# Patient Record
Sex: Female | Born: 1985 | Race: Black or African American | Hispanic: No | Marital: Single | Smoking: Never smoker
Health system: Southern US, Community
[De-identification: ages and names within clinical notes are randomized; demographics above are authoritative.]

## PROBLEM LIST (undated history)

## (undated) DIAGNOSIS — I10 Essential (primary) hypertension: Secondary | ICD-10-CM

---
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
## 2007-01-26 ENCOUNTER — Emergency Department: Payer: Self-pay | Admitting: Emergency Medicine

## 2007-02-02 ENCOUNTER — Emergency Department: Payer: Self-pay | Admitting: Emergency Medicine

## 2007-07-23 ENCOUNTER — Observation Stay: Payer: Self-pay | Admitting: Obstetrics and Gynecology

## 2007-09-17 ENCOUNTER — Inpatient Hospital Stay: Payer: Self-pay | Admitting: Obstetrics and Gynecology

## 2009-03-02 ENCOUNTER — Emergency Department: Payer: Self-pay | Admitting: Emergency Medicine

## 2009-03-03 ENCOUNTER — Inpatient Hospital Stay (HOSPITAL_COMMUNITY): Admission: AD | Admit: 2009-03-03 | Discharge: 2009-03-03 | Payer: Self-pay | Admitting: Obstetrics & Gynecology

## 2009-06-09 ENCOUNTER — Emergency Department: Payer: Self-pay | Admitting: Unknown Physician Specialty

## 2009-09-08 ENCOUNTER — Inpatient Hospital Stay (HOSPITAL_COMMUNITY): Admission: AD | Admit: 2009-09-08 | Discharge: 2009-09-10 | Payer: Self-pay | Admitting: Obstetrics and Gynecology

## 2009-09-08 ENCOUNTER — Encounter: Payer: Self-pay | Admitting: Obstetrics & Gynecology

## 2009-09-08 ENCOUNTER — Ambulatory Visit: Payer: Self-pay | Admitting: Advanced Practice Midwife

## 2010-03-22 LAB — CBC
HCT: 30.8 % — ABNORMAL LOW (ref 36.0–46.0)
Hemoglobin: 10.4 g/dL — ABNORMAL LOW (ref 12.0–15.0)
Hemoglobin: 10.8 g/dL — ABNORMAL LOW (ref 12.0–15.0)
MCH: 29.7 pg (ref 26.0–34.0)
MCHC: 34.2 g/dL (ref 30.0–36.0)
MCV: 86.7 fL (ref 78.0–100.0)
Platelets: 241 10*3/uL (ref 150–400)
Platelets: 261 10*3/uL (ref 150–400)
RBC: 3.56 MIL/uL — ABNORMAL LOW (ref 3.87–5.11)
RBC: 3.62 MIL/uL — ABNORMAL LOW (ref 3.87–5.11)
RDW: 14.6 % (ref 11.5–15.5)

## 2010-03-22 LAB — PROTEIN / CREATININE RATIO, URINE
Creatinine, Urine: 40.3 mg/dL
Protein Creatinine Ratio: 0.32 — ABNORMAL HIGH (ref 0.00–0.15)
Total Protein, Urine: 13 mg/dL

## 2010-03-22 LAB — COMPREHENSIVE METABOLIC PANEL
AST: 29 U/L (ref 0–37)
Alkaline Phosphatase: 139 U/L — ABNORMAL HIGH (ref 39–117)
BUN: 1 mg/dL — ABNORMAL LOW (ref 6–23)
Calcium: 8.5 mg/dL (ref 8.4–10.5)
Potassium: 3.3 mEq/L — ABNORMAL LOW (ref 3.5–5.1)
Total Protein: 5.8 g/dL — ABNORMAL LOW (ref 6.0–8.3)

## 2010-03-22 LAB — STREP B DNA PROBE

## 2010-03-30 LAB — URINE CULTURE

## 2010-03-30 LAB — URINALYSIS, ROUTINE W REFLEX MICROSCOPIC
Glucose, UA: NEGATIVE mg/dL
Ketones, ur: NEGATIVE mg/dL
Nitrite: POSITIVE — AB
Specific Gravity, Urine: 1.015 (ref 1.005–1.030)
Urobilinogen, UA: 0.2 mg/dL (ref 0.0–1.0)
pH: 7 (ref 5.0–8.0)

## 2010-03-30 LAB — URINE MICROSCOPIC-ADD ON

## 2010-03-30 LAB — CBC
RBC: 4.22 MIL/uL (ref 3.87–5.11)
RDW: 13.6 % (ref 11.5–15.5)
WBC: 10.5 10*3/uL (ref 4.0–10.5)

## 2010-03-30 LAB — GC/CHLAMYDIA PROBE AMP, GENITAL
Chlamydia, DNA Probe: NEGATIVE
GC Probe Amp, Genital: NEGATIVE

## 2010-03-30 LAB — ABO/RH: ABO/RH(D): O POS

## 2010-06-13 ENCOUNTER — Emergency Department (HOSPITAL_COMMUNITY)
Admission: EM | Admit: 2010-06-13 | Discharge: 2010-06-13 | Disposition: A | Discharge status: Home / Self Care | Payer: Self-pay | Attending: Emergency Medicine | Admitting: Emergency Medicine

## 2010-06-13 DIAGNOSIS — J45909 Unspecified asthma, uncomplicated: Secondary | ICD-10-CM | POA: Insufficient documentation

## 2010-06-13 DIAGNOSIS — I10 Essential (primary) hypertension: Secondary | ICD-10-CM | POA: Insufficient documentation

## 2010-06-13 LAB — POCT I-STAT, CHEM 8
BUN: 13 mg/dL (ref 6–23)
Calcium, Ion: 1.14 mmol/L (ref 1.12–1.32)
Chloride: 108 mEq/L (ref 96–112)
Creatinine, Ser: 0.8 mg/dL (ref 0.4–1.2)
Glucose, Bld: 78 mg/dL (ref 70–99)
Hemoglobin: 13.6 g/dL (ref 12.0–15.0)
Potassium: 3.5 mEq/L (ref 3.5–5.1)
Sodium: 142 mEq/L (ref 135–145)

## 2010-08-16 ENCOUNTER — Emergency Department (HOSPITAL_COMMUNITY)
Admission: EM | Admit: 2010-08-16 | Discharge: 2010-08-16 | Disposition: A | Discharge status: Home / Self Care | Payer: Self-pay | Attending: Emergency Medicine | Admitting: Emergency Medicine

## 2010-08-16 DIAGNOSIS — I10 Essential (primary) hypertension: Secondary | ICD-10-CM | POA: Insufficient documentation

## 2010-08-16 DIAGNOSIS — N949 Unspecified condition associated with female genital organs and menstrual cycle: Secondary | ICD-10-CM | POA: Insufficient documentation

## 2010-08-16 DIAGNOSIS — N938 Other specified abnormal uterine and vaginal bleeding: Secondary | ICD-10-CM | POA: Insufficient documentation

## 2010-08-16 LAB — CBC
HCT: 38.5 % (ref 36.0–46.0)
MCV: 83.5 fL (ref 78.0–100.0)
Platelets: 304 10*3/uL (ref 150–400)
RDW: 13.4 % (ref 11.5–15.5)

## 2010-08-16 LAB — DIFFERENTIAL
Basophils Absolute: 0 10*3/uL (ref 0.0–0.1)
Basophils Relative: 0 % (ref 0–1)
Eosinophils Absolute: 0.2 10*3/uL (ref 0.0–0.7)
Eosinophils Relative: 2 % (ref 0–5)
Lymphs Abs: 3.1 10*3/uL (ref 0.7–4.0)
Monocytes Absolute: 0.5 10*3/uL (ref 0.1–1.0)
Neutrophils Relative %: 54 % (ref 43–77)

## 2010-08-16 LAB — URINALYSIS, ROUTINE W REFLEX MICROSCOPIC
Bilirubin Urine: NEGATIVE
Glucose, UA: NEGATIVE mg/dL
Ketones, ur: NEGATIVE mg/dL
Protein, ur: 30 mg/dL — AB
Specific Gravity, Urine: 1.026 (ref 1.005–1.030)
pH: 6 (ref 5.0–8.0)

## 2010-08-16 LAB — URINE MICROSCOPIC-ADD ON

## 2010-08-16 LAB — WET PREP, GENITAL
Clue Cells Wet Prep HPF POC: NONE SEEN
Yeast Wet Prep HPF POC: NONE SEEN

## 2010-08-17 LAB — GC/CHLAMYDIA PROBE AMP, GENITAL: GC Probe Amp, Genital: NEGATIVE

## 2011-02-20 ENCOUNTER — Emergency Department (HOSPITAL_COMMUNITY): Payer: Self-pay

## 2011-02-20 ENCOUNTER — Emergency Department (HOSPITAL_COMMUNITY)
Admission: EM | Admit: 2011-02-20 | Discharge: 2011-02-20 | Disposition: A | Discharge status: Home / Self Care | Payer: Self-pay | Attending: Emergency Medicine | Admitting: Emergency Medicine

## 2011-02-20 ENCOUNTER — Other Ambulatory Visit: Payer: Self-pay

## 2011-02-20 ENCOUNTER — Encounter (HOSPITAL_COMMUNITY): Payer: Self-pay | Admitting: Emergency Medicine

## 2011-02-20 DIAGNOSIS — R091 Pleurisy: Secondary | ICD-10-CM | POA: Insufficient documentation

## 2011-02-20 DIAGNOSIS — R0602 Shortness of breath: Secondary | ICD-10-CM | POA: Insufficient documentation

## 2011-02-20 DIAGNOSIS — R071 Chest pain on breathing: Secondary | ICD-10-CM | POA: Insufficient documentation

## 2011-02-20 DIAGNOSIS — J45901 Unspecified asthma with (acute) exacerbation: Secondary | ICD-10-CM | POA: Insufficient documentation

## 2011-02-20 DIAGNOSIS — I10 Essential (primary) hypertension: Secondary | ICD-10-CM | POA: Insufficient documentation

## 2011-02-20 HISTORY — DX: Essential (primary) hypertension: I10

## 2011-02-20 MED ORDER — ALBUTEROL SULFATE HFA 108 (90 BASE) MCG/ACT IN AERS
2.0000 | INHALATION_SPRAY | Freq: Once | RESPIRATORY_TRACT | Status: AC
  Administered 2011-02-20: 2 via RESPIRATORY_TRACT
  Filled 2011-02-20: qty 6.7

## 2011-02-20 MED ORDER — ACETAMINOPHEN 325 MG PO TABS
650.0000 mg | ORAL_TABLET | Freq: Once | ORAL | Status: AC
  Administered 2011-02-20: 650 mg via ORAL
  Filled 2011-02-20: qty 2

## 2011-02-20 MED ORDER — PREDNISONE 10 MG PO TABS: 40.0000 mg | ORAL_TABLET | Freq: Every day | ORAL | Status: DC

## 2011-02-20 NOTE — Discharge Instructions (Signed)
Your ECG and chest x-ray are both normal. You vital signs are normal as well. I suspect your chest pain is partially due to asthma, and partially to inflammation around your lungs. Start taking ibuprofen 400mg  every 6hrs for pain and inflammation. Take prednisone as prescribed until all gone. Use albuterol inhaler 2 puffs every 4 hrs for shortness of breath. Follow up with primary care doctor here in the area.   RESOURCE GUIDE  Dental Problems  Patients with Medicaid: [facility] [phone] [address]                                           [phone] Phone:  [phone]                                                  Phone:  [phone]  If unable to pay or uninsured, contact:  [facility] or [facility] to become qualified for the adult dental clinic.  Chronic Pain Problems Contact [facility]  [phone] Patients need to be referred by their primary care doctor.  Insufficient Money for Medicine Contact [employer]:  call "211" or [facility] [phone].  No Primary Care Doctor Call [facility]  [phone] Other agencies that provide inexpensive medical care    [facility]  [phone]    [facility]  [phone]    [facility]  [phone]    [facility]  [phone]    [facility]  [phone]    [facility]  [phone]  Psychological Services [facility]al [facility]  [phone] [facility] Services  [phone] [facility] Mental Health   [phone] (emergency services [phone])  Substance Abuse Resources [facility]  [phone] [facility] [phone] [facility] [phone] Floydene Flock [phone] Residential & Outpatient Substance Abuse Program  [phone]  Abuse/Neglect [facility] Child Abuse Hotline 928-704-6570 [facility] Child Abuse Hotline (907)334-7309 (After  Hours)  Emergency Shelter [facility] [phone]  M[facility] [phone] [facility] [phone]  MRSA Hotline #:   708-760-0229    [facility] Resources  [facility]     [employer]                          [facility] Dept. [address] [place]                       [address]      [address]  Patrecia Pace  Michell Heinrich Phone:  [phone]                                   Phone:  [phone]                 Phone:  [phone]  [facility] Mental Health Phone:  [phone]  [facility] Child Abuse Hotline 865-280-4676 8034896035 (After Hours)    Asthma, Adult Asthma is caused by narrowing of the air passages in the lungs. It may be triggered by pollen, dust, animal dander, molds, some foods, respiratory infections, exposure to smoke, exercise, emotional stress or other allergens (things that cause allergic reactions or allergies). Repeat attacks are common. HOME CARE INSTRUCTIONS   Use prescription medications as ordered by your caregiver.   Avoid pollen, dust, animal dander, molds, smoke and other things that cause attacks at home and at work.   You may have fewer attacks if you decrease dust in your home. Electrostatic air cleaners may help.   It may help to replace your pillows or mattress with materials less likely to cause allergies.   Talk to your caregiver about an action plan for managing asthma attacks at home, including, the use of a peak flow meter which measures the severity of your asthma attack. An action plan can help minimize or stop the attack without having to seek medical care.   If you are not on a fluid restriction, drink 8 to 10 glasses of water each day.   Always have a plan prepared for seeking medical attention, including, calling  your physician, accessing local emergency care, and calling 911 (in the [place] for a severe attack.   Discuss possible exercise routines with your caregiver.   If animal dander is the cause of asthma, you may need to get rid of pets.  SEEK MEDICAL CARE IF:   You have wheezing and shortness of breath even if taking medicine to prevent attacks.   You have muscle aches, chest pain or thickening of sputum.   Your sputum changes from clear or white to yellow, green, gray, or bloody.   You have any problems that may be related to the medicine you are taking (such as a rash, itching, swelling or trouble breathing).  SEEK IMMEDIATE MEDICAL CARE IF:   Your usual medicines do not stop your wheezing or there is increased coughing and/or shortness of breath.   You have increased difficulty breathing.   You have a fever.  MAKE SURE YOU:   Understand these instructions.   Will watch your condition.   Will get help right away if you are not doing well or get worse.  Document Released: 12/24/2004 Document Revised: 09/05/2010 Document Reviewed: 08/12/2007 [facility] Patient Information 2012 [place], [place].

## 2011-02-20 NOTE — ED Notes (Signed)
Sob hard to breath  x2 days ran out of inhaler  First of jan  Just moved her from [place]

## 2011-02-20 NOTE — ED Provider Notes (Signed)
History     CSN: [id number]  Arrival date & time 02/20/11  1021   First MD Initiated Contact with Patient 02/20/11 1040      Chief Complaint  Patient presents with  . Shortness of Breath    (Consider location/radiation/quality/duration/timing/severity/associated sxs/prior treatment) Patient is a 26 y.o. female presenting with shortness of breath. The history is provided by the patient.  Shortness of Breath  The current episode started 2 days ago. The onset was gradual. The problem has been unchanged. The problem is moderate. The symptoms are relieved by nothing. Associated symptoms include chest pain and shortness of breath. Pertinent negatives include no chest pressure, no fever, no rhinorrhea, no cough and no wheezing. There was no intake of a foreign body. Her past medical history is significant for asthma.  Pt states she has history of asthma, ran out of her inhaler. States she has had no problems with asthma in the last year until about two days ago. States also having pleuritic chest pain, mainly in the midline of the chest, worse with deep inspiration and palpation. Denies fever, chills. URI symptoms, cough. No recent travel or surgeries. No swelling in legs. Denies being pregnant. Not taking OCPs.  Past Medical History  Diagnosis Date  . Asthma   . Hypertension     No past surgical history on file.  No family history on file.  History  Substance Use Topics  . Smoking status: Not on file  . Smokeless tobacco: Not on file  . Alcohol Use:     OB History    Grav Para Term Preterm Abortions TAB SAB Ect Mult Living                  Review of Systems  Constitutional: Negative for fever and chills.  HENT: Negative for ear pain, rhinorrhea, neck pain and neck stiffness.   Eyes: Negative.   Respiratory: Positive for chest tightness and shortness of breath. Negative for cough and wheezing.   Cardiovascular: Positive for chest pain. Negative for palpitations and leg  swelling.  Gastrointestinal: Negative.   Genitourinary: Negative.   Musculoskeletal: Negative.   Neurological: Negative.   Psychiatric/Behavioral: Negative.     Allergies  Review of patient's allergies indicates no known allergies.  Home Medications   Current Outpatient Rx  Name Route Sig Dispense Refill  . ALBUTEROL SULFATE HFA 108 (90 BASE) MCG/ACT IN AERS Inhalation Inhale 2 puffs into the lungs 2 (two) times daily as needed. As needed for shortness of breath.      BP 155/100  Pulse 89  Temp(Src) 97.1 F (36.2 C) (Oral)  Resp 16  SpO2 97%  LMP 02/08/2011  Physical Exam  Nursing note and vitals reviewed. Constitutional: She is oriented to person, place, and time. She appears well-developed and well-nourished. No distress.  HENT:  Head: Normocephalic.  Eyes: Conjunctivae and EOM are normal. Pupils are equal, round, and reactive to light.  Neck: Neck supple.  Cardiovascular: Normal rate, regular rhythm and normal heart sounds.   Pulmonary/Chest: Effort normal and breath sounds normal. No respiratory distress. She has no wheezes. She has no rales.  Abdominal: Soft. Bowel sounds are normal.  Musculoskeletal: Normal range of motion. She exhibits no edema.  Neurological: She is alert and oriented to person, place, and time.  Skin: Skin is warm and dry. No erythema.  Psychiatric: She has a normal mood and affect.    ED Course  Procedures (including critical care time)  Labs Reviewed - No data to  display Dg Chest 2 View  02/20/2011  *RADIOLOGY REPORT*  Clinical Data: Shortness of breath and cough.  CHEST - 2 VIEW  Comparison: None.  Findings: Trachea is midline.  Heart size normal.  Lungs are somewhat low in volume but clear.  No pleural fluid.  IMPRESSION: No acute findings.  Original Report Authenticated By: Reyes Ivan, M.D.    Pt with shortness of breath, pleurisy. She is PERC and Well's negative for PE. VS normal. ECG normal. Suspect mild exacerbation of asthma.  Will d/c home with outpatient follow up.    Date: 02/20/2011  Rate: 94  Rhythm: normal sinus rhythm  QRS Axis: normal  Intervals: normal  ST/T Wave abnormalities: normal  Conduction Disutrbances: none  Narrative Interpretation:   Old EKG Reviewed: no prior ecg   No diagnosis found.    MDM          Lottie Mussel, PA 02/20/11 1137

## 2011-02-20 NOTE — ED Provider Notes (Signed)
Medical screening examination/treatment/procedure(s) were performed by non-physician practitioner and as supervising physician I was immediately available for consultation/collaboration.  Toy Baker, MD 02/20/11 2028

## 2011-02-20 NOTE — ED Notes (Signed)
MD at bedside. 

## 2011-02-20 NOTE — ED Notes (Signed)
Pt received to STR 5 with c/o SOB onset 2 days ago. Pt has a hx of asthma and has been out of her inhaler x 1 year . Pt also c/o chest tightness. Pt denies any cough nor fever

## 2011-07-22 ENCOUNTER — Emergency Department (HOSPITAL_COMMUNITY)
Admission: EM | Admit: 2011-07-22 | Discharge: 2011-07-22 | Disposition: A | Discharge status: Home Health | Payer: Self-pay | Attending: Emergency Medicine | Admitting: Emergency Medicine

## 2011-07-22 ENCOUNTER — Encounter (HOSPITAL_COMMUNITY): Payer: Self-pay | Admitting: *Deleted

## 2011-07-22 DIAGNOSIS — R531 Weakness: Secondary | ICD-10-CM

## 2011-07-22 DIAGNOSIS — J45909 Unspecified asthma, uncomplicated: Secondary | ICD-10-CM | POA: Insufficient documentation

## 2011-07-22 DIAGNOSIS — I10 Essential (primary) hypertension: Secondary | ICD-10-CM | POA: Insufficient documentation

## 2011-07-22 DIAGNOSIS — R5381 Other malaise: Secondary | ICD-10-CM | POA: Insufficient documentation

## 2011-07-22 LAB — POCT I-STAT, CHEM 8
Calcium, Ion: 1.24 mmol/L — ABNORMAL HIGH (ref 1.12–1.23)
Chloride: 106 mEq/L (ref 96–112)
Glucose, Bld: 112 mg/dL — ABNORMAL HIGH (ref 70–99)
HCT: 40 % (ref 36.0–46.0)
Hemoglobin: 13.6 g/dL (ref 12.0–15.0)

## 2011-07-22 NOTE — ED Provider Notes (Signed)
History    This chart was scribed for Suzi Roots, MD, MD by Smitty Pluck. The patient was seen in room TR04C and the patient's care was started at 4:25PM.   CSN: [id number]  Arrival date & time 07/22/11  1549   None     Chief Complaint  Patient presents with  . Fatigue    (Consider location/radiation/quality/duration/timing/severity/associated sxs/prior treatment) The history is provided by the patient.   Angela Friedman is a 26 y.o. female who presents to the Emergency Department complaining of moderate fatigue onset today while at work. 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 Denies vomiting and diarrhea. Reports having normal food and liquid intake. Reports having mild intermittent frontal headache. Pt has hx of migraines. todays headache, gradual in onset, mild at onset, denies any severe head pain or severe headache. No eye pain or change in vision. No nv. No neck pain or stiffness. No trauma or head injury. No numbness/weakness.  Denies being exposed to new chemicals or strong odors at work. Denies having recent blood lose, heavy menstrual cycle, being pregnant, palpitations, heart conditions, chest pain and SOB. Denies change in stools, melena or rectal bleeding. Pt states no chance of being pregnant. No abd/pelv pain. No vaginal bleeding. No fever or chills. Denies any current or recent cp. No palpitations or irreg hr. No recent new meds or any drug use.      Past Medical History  Diagnosis Date  . Asthma   . Hypertension     History reviewed. No pertinent past surgical history.  History reviewed. No pertinent family history.  History  Substance Use Topics  . Smoking status: Never Smoker   . Smokeless tobacco: Not on file  . Alcohol Use: No    OB History    Grav Para Term Preterm Abortions TAB SAB Ect Mult Living                  Review of Systems  Constitutional: Positive for fatigue. Negative for fever and chills.  HENT: Negative for sore  throat, neck pain and neck stiffness.   Eyes: Negative for visual disturbance.  Respiratory: Negative for cough and shortness of breath.   Cardiovascular: Negative for chest pain, palpitations and leg swelling.  Gastrointestinal: Negative for nausea, vomiting, abdominal pain and diarrhea.  Genitourinary: Negative for dysuria, vaginal bleeding and vaginal discharge.  Musculoskeletal: Negative for back pain.  Skin: Negative for rash.  Neurological: Positive for headaches. Negative for syncope and numbness.    Allergies  Review of patient's allergies indicates no known allergies.  Home Medications   Current Outpatient Rx  Name Route Sig Dispense Refill  . IBUPROFEN 200 MG PO TABS Oral Take 400 mg by mouth every 6 (six) hours as needed. For pain    . ALBUTEROL SULFATE HFA 108 (90 BASE) MCG/ACT IN AERS Inhalation Inhale 2 puffs into the lungs 2 (two) times daily as needed. As needed for shortness of breath.      BP 130/91  Pulse 101  Temp 98.8 F (37.1 C) (Oral)  Resp 18  SpO2 98%  Physical Exam  Nursing note and vitals reviewed. Constitutional: She is oriented to person, place, and time. She appears well-developed and well-nourished. No distress.  HENT:  Head: Normocephalic and atraumatic.  Nose: Nose normal.  Mouth/Throat: Oropharynx is clear and moist.       No sinus or temporal tenderness  Eyes: Conjunctivae and EOM are normal. Pupils are equal, round, and reactive to  light. No scleral icterus.  Neck: Neck supple. No tracheal deviation present.       No stiffness or rigidity  Cardiovascular: Normal rate, regular rhythm, normal heart sounds and intact distal pulses.  Exam reveals no gallop and no friction rub.   No murmur heard. Pulmonary/Chest: Effort normal and breath sounds normal. No respiratory distress.  Abdominal: Soft. Bowel sounds are normal. She exhibits no distension and no mass. There is no tenderness. There is no rebound and no guarding.  Genitourinary:        No cva tenderness  Musculoskeletal: Normal range of motion.  Neurological: She is alert and oriented to person, place, and time. No cranial nerve deficit.       Motor intact bil. Steady gait.   Skin: Skin is warm and dry.  Psychiatric: She has a normal mood and affect. Her behavior is normal.    ED Course  Procedures (including critical care time) DIAGNOSTIC STUDIES: Oxygen Saturation is 98% on room air, normal by my interpretation.    COORDINATION OF CARE:  Results for orders placed during the hospital encounter of 07/22/11  POCT I-STAT, CHEM 8      Component Value Range   Sodium 142  135 - 145 mEq/L   Potassium 3.8  3.5 - 5.1 mEq/L   Chloride 106  96 - 112 mEq/L   BUN 8  6 - 23 mg/dL   Creatinine, Ser 8.46  0.50 - 1.10 mg/dL   Glucose, Bld 962 (*) 70 - 99 mg/dL   Calcium, Ion 9.52 (*) 1.12 - 1.23 mmol/L   TCO2 24  0 - 100 mmol/L   Hemoglobin 13.6  12.0 - 15.0 g/dL   HCT 84.1  32.4 - 40.1 %         MDM  I personally performed the services described in this documentation, which was scribed in my presence. The recorded information has been reviewed and considered. Suzi Roots, MD  Pt states at work stands all day. States in generally air-conditioned but was very hot today. Feels better now.  Tylenol po. Po fluids.   Pt ambulatory about ed, no faintness or dizziness. Tolerates po.   Hr 84 rr 16. Nad. No pain.     Suzi Roots, MD 07/22/11 [phone]

## 2011-07-22 NOTE — ED Notes (Signed)
Pt states she was at work when she started to feel mildly light headed and weak. Denies fever, sob, or chest pain. Pt states she feels weak and points to bilateral shoulders. Pt reports hx of intermittent headaches for last couple days and otc will relieve symptoms temporarily.

## 2013-09-25 ENCOUNTER — Emergency Department: Payer: Self-pay | Admitting: Emergency Medicine

## 2013-09-25 LAB — COMPREHENSIVE METABOLIC PANEL
ALBUMIN: 3.2 g/dL — AB (ref 3.4–5.0)
ALK PHOS: 92 U/L
ANION GAP: 10 (ref 7–16)
BILIRUBIN TOTAL: 0.2 mg/dL (ref 0.2–1.0)
BUN: 7 mg/dL (ref 7–18)
CALCIUM: 8.7 mg/dL (ref 8.5–10.1)
CO2: 24 mmol/L (ref 21–32)
Chloride: 105 mmol/L (ref 98–107)
Creatinine: 0.75 mg/dL (ref 0.60–1.30)
GLUCOSE: 83 mg/dL (ref 65–99)
Osmolality: 275 (ref 275–301)
Potassium: 3.2 mmol/L — ABNORMAL LOW (ref 3.5–5.1)
SGOT(AST): 36 U/L (ref 15–37)
SGPT (ALT): 37 U/L
Sodium: 139 mmol/L (ref 136–145)
Total Protein: 7.2 g/dL (ref 6.4–8.2)

## 2013-09-25 LAB — CBC
HCT: 37.8 % (ref 35.0–47.0)
HGB: 12.3 g/dL (ref 12.0–16.0)
MCH: 27.9 pg (ref 26.0–34.0)
MCHC: 32.7 g/dL (ref 32.0–36.0)
MCV: 85 fL (ref 80–100)
Platelet: 294 10*3/uL (ref 150–440)
RBC: 4.42 10*6/uL (ref 3.80–5.20)
RDW: 13.8 % (ref 11.5–14.5)
WBC: 9.2 10*3/uL (ref 3.6–11.0)

## 2013-09-25 LAB — TROPONIN I: Troponin-I: <0.02 ng/mL

## 2013-09-25 LAB — LIPASE, BLOOD: Lipase: 77 U/L (ref 73–393)

## 2014-03-11 ENCOUNTER — Emergency Department: Payer: Self-pay | Admitting: Emergency Medicine

## 2014-07-29 ENCOUNTER — Emergency Department
Admission: EM | Admit: 2014-07-29 | Discharge: 2014-07-29 | Disposition: A | Discharge status: Home / Self Care | Payer: Self-pay | Attending: Emergency Medicine | Admitting: Emergency Medicine

## 2014-07-29 ENCOUNTER — Other Ambulatory Visit: Payer: Self-pay

## 2014-07-29 DIAGNOSIS — R079 Chest pain, unspecified: Secondary | ICD-10-CM | POA: Insufficient documentation

## 2014-07-29 MED ORDER — GI COCKTAIL ~~LOC~~
30.0000 mL | Freq: Once | ORAL | Status: AC
  Administered 2014-07-29: 30 mL via ORAL

## 2014-07-29 MED ORDER — GI COCKTAIL ~~LOC~~
ORAL | Status: AC
Start: 2014-07-29 — End: 2014-07-29
  Administered 2014-07-29: 30 mL via ORAL
  Filled 2014-07-29: qty 30

## 2014-07-29 MED ORDER — CYCLOBENZAPRINE HCL 10 MG PO TABS: 10.0000 mg | ORAL_TABLET | Freq: Three times a day (TID) | ORAL | Status: DC | PRN

## 2014-07-29 NOTE — Discharge Instructions (Signed)

## 2014-07-29 NOTE — ED Provider Notes (Signed)
[facility] Emergency Department Provider Note  ____________________________________________  Time seen: Approximately 9:30 PM  I have reviewed the triage vital signs and the nursing notes.   HISTORY  Chief Complaint Chest Pain    HPI Angela Friedman is a 29 y.o. female with a history of hypertension who is presenting today with chest pain over the past day. Says the chest pain is central and sharp. It can last for about 10 minutes at a time. It is exacerbated by movement. It is worse with bending. She works in a [employer] and does heavy lifting at her job. She denies any shortness of breath or pain with deep breathing. She has tried ibuprofen for the pain which has not provided any relief. Has a history of coronary artery disease with her father who died in his 78s. No known cardiac death at young age and her family. No exogenous hormones at this time. Is not on birth control this time. No chest pain at this time. Was given a GI cocktail prior to my interview and said provided minimal relief.   Past Medical History  Diagnosis Date  . Asthma   . Hypertension     There are no active problems to display for this patient.   No past surgical history on file.  Current Outpatient Rx  Name  Route  Sig  Dispense  Refill  . albuterol (PROVENTIL HFA;VENTOLIN HFA) 108 (90 BASE) MCG/ACT inhaler   Inhalation   Inhale 2 puffs into the lungs 2 (two) times daily as needed. As needed for shortness of breath.         Marland Kitchen ibuprofen (ADVIL,MOTRIN) 200 MG tablet   Oral   Take 400 mg by mouth every 6 (six) hours as needed. For pain           Allergies Review of patient's allergies indicates no known allergies.  No family history on file.  Social History History  Substance Use Topics  . Smoking status: Never Smoker   . Smokeless tobacco: Not on file  . Alcohol Use: No    Review of Systems Constitutional: No fever/chills Eyes: No visual changes. ENT: No sore  throat. Cardiovascular: As above Respiratory: Denies shortness of breath. Gastrointestinal: No abdominal pain.  No nausea, no vomiting.  No diarrhea.  No constipation. Genitourinary: Negative for dysuria. Musculoskeletal: Negative for back pain. Skin: Negative for rash. Neurological: Negative for headaches, focal weakness or numbness.  10-point ROS otherwise negative.  ____________________________________________   PHYSICAL EXAM:  VITAL SIGNS: ED Triage Vitals  Enc Vitals Group     BP 07/29/14 2050 157/98 mmHg     Pulse Rate 07/29/14 2050 90     Resp --      Temp 07/29/14 2050 98.3 F (36.8 C)     Temp Source 07/29/14 2050 Oral     SpO2 07/29/14 2050 100 %     Weight --      Height --      Head Cir --      Peak Flow --      Pain Score 07/29/14 2039 8     Pain Loc --      Pain Edu? --      Excl. in GC? --     Constitutional: Alert and oriented. Well appearing and in no acute distress. Eyes: Conjunctivae are normal. PERRL. EOMI. Head: Atraumatic. Nose: No congestion/rhinnorhea. Mouth/Throat: Mucous membranes are moist.  Oropharynx non-erythematous. Neck: No stridor.   Cardiovascular: Normal rate, regular rhythm. Grossly  normal heart sounds.  Good peripheral circulation. Chest pain is reproducible to palpation. Respiratory: Normal respiratory effort.  No retractions. Lungs CTAB. Gastrointestinal: Soft and nontender. No distention. No abdominal bruits. No CVA tenderness. Musculoskeletal: No lower extremity tenderness nor edema.  No joint effusions. Neurologic:  Normal speech and language. No gross focal neurologic deficits are appreciated. No gait instability. Skin:  Skin is warm, dry and intact. No rash noted. Psychiatric: Mood and affect are normal. Speech and behavior are normal.  ____________________________________________   LABS (all labs ordered are listed, but only abnormal results are displayed)  Labs Reviewed - No data to  display ____________________________________________  EKG  ED ECG REPORT I, Birdia Jaycox,  Teena Irani, the attending physician, personally viewed and interpreted this ECG.   Date: 07/29/2014  EKG Time: 2047  Rate: 83  Rhythm: Sinus rhythm with PACs.  Axis: Normal axis  Intervals:none  ST&T Change: No ST elevations or depressions. No abnormal T-wave inversions.  ____________________________________________  RADIOLOGY   ____________________________________________   PROCEDURES    ____________________________________________   INITIAL IMPRESSION / ASSESSMENT AND PLAN / ED COURSE  Pertinent labs & imaging results that were available during my care of the patient were reviewed by me and considered in my medical decision making (see chart for details).  Patient is perc negative. Reproducible chest pain and patient does a lot of lifting on her job. We'll discharge to home. To continue ibuprofen and we'll give her Flexeril. Patient will also try muscle rub such as icy hot and Aspercreme. ____________________________________________   FINAL CLINICAL IMPRESSION(S) / ED DIAGNOSES  Acute chest pain. Initial visit.      Myrna Blazer, MD 07/29/14 2149

## 2014-07-29 NOTE — ED Notes (Signed)
Pt reports midsternal chest pain, burning in nature, worse when lying down. Pt reports pain started yesterday.

## 2015-05-25 ENCOUNTER — Encounter: Payer: Self-pay | Admitting: Emergency Medicine

## 2015-05-25 ENCOUNTER — Emergency Department
Admission: EM | Admit: 2015-05-25 | Discharge: 2015-05-25 | Disposition: A | Discharge status: Home / Self Care | Payer: Medicaid Other | Attending: Student | Admitting: Student

## 2015-05-25 DIAGNOSIS — Z3A10 10 weeks gestation of pregnancy: Secondary | ICD-10-CM | POA: Diagnosis not present

## 2015-05-25 DIAGNOSIS — Y999 Unspecified external cause status: Secondary | ICD-10-CM | POA: Insufficient documentation

## 2015-05-25 DIAGNOSIS — Y929 Unspecified place or not applicable: Secondary | ICD-10-CM | POA: Diagnosis not present

## 2015-05-25 DIAGNOSIS — S40861A Insect bite (nonvenomous) of right upper arm, initial encounter: Secondary | ICD-10-CM

## 2015-05-25 DIAGNOSIS — I10 Essential (primary) hypertension: Secondary | ICD-10-CM | POA: Insufficient documentation

## 2015-05-25 DIAGNOSIS — O26891 Other specified pregnancy related conditions, first trimester: Secondary | ICD-10-CM | POA: Diagnosis not present

## 2015-05-25 DIAGNOSIS — Y939 Activity, unspecified: Secondary | ICD-10-CM | POA: Insufficient documentation

## 2015-05-25 DIAGNOSIS — W57XXXA Bitten or stung by nonvenomous insect and other nonvenomous arthropods, initial encounter: Secondary | ICD-10-CM | POA: Insufficient documentation

## 2015-05-25 DIAGNOSIS — Z79899 Other long term (current) drug therapy: Secondary | ICD-10-CM | POA: Insufficient documentation

## 2015-05-25 DIAGNOSIS — R21 Rash and other nonspecific skin eruption: Secondary | ICD-10-CM | POA: Diagnosis present

## 2015-05-25 DIAGNOSIS — J45909 Unspecified asthma, uncomplicated: Secondary | ICD-10-CM | POA: Diagnosis not present

## 2015-05-25 LAB — CBC WITH DIFFERENTIAL/PLATELET
BASOS ABS: 0 10*3/uL (ref 0–0.1)
Basophils Relative: 0 %
EOS ABS: 0.2 10*3/uL (ref 0–0.7)
Eosinophils Relative: 2 %
HCT: 33.5 % — ABNORMAL LOW (ref 35.0–47.0)
HEMOGLOBIN: 11.3 g/dL — AB (ref 12.0–16.0)
LYMPHS ABS: 2.4 10*3/uL (ref 1.0–3.6)
Lymphocytes Relative: 18 %
MCH: 28.5 pg (ref 26.0–34.0)
MCHC: 33.9 g/dL (ref 32.0–36.0)
MCV: 84.1 fL (ref 80.0–100.0)
Monocytes Absolute: 0.8 10*3/uL (ref 0.2–0.9)
Monocytes Relative: 6 %
Neutro Abs: 10.1 10*3/uL — ABNORMAL HIGH (ref 1.4–6.5)
Platelets: 270 10*3/uL (ref 150–440)
RBC: 3.98 MIL/uL (ref 3.80–5.20)
RDW: 15.3 % — ABNORMAL HIGH (ref 11.5–14.5)
WBC: 13.5 10*3/uL — AB (ref 3.6–11.0)

## 2015-05-25 MED ORDER — CEPHALEXIN 500 MG PO CAPS: 500.0000 mg | ORAL_CAPSULE | Freq: Three times a day (TID) | ORAL | Status: AC

## 2015-05-25 MED ORDER — TRIAMCINOLONE ACETONIDE 0.1 % EX CREA: 1.0000 "application " | TOPICAL_CREAM | Freq: Two times a day (BID) | CUTANEOUS | Status: AC

## 2015-05-25 NOTE — Discharge Instructions (Signed)
Insect Bite Mosquitoes, flies, fleas, bedbugs, and many other insects can bite. Insect bites are different from insect stings. A sting is when poison (venom) is injected into the skin. Insect bites can cause pain or itching for a few days, but they are usually not serious. Some insects can spread diseases to people through a bite. SYMPTOMS  Symptoms of an insect bite include:  Itching or pain in the bite area.  Redness and swelling in the bite area.  An open wound (skin ulcer). In many cases, symptoms last for 2-4 days.  DIAGNOSIS  This condition is usually diagnosed based on symptoms and a physical exam. TREATMENT  Treatment is usually not needed for an insect bite. Symptoms often go away on their own. Your health care provider may recommend creams or lotions to help reduce itching. Antibiotic medicines may be prescribed if the bite becomes infected. A tetanus shot may be given in some cases. If you develop an allergic reaction to an insect bite, your health care provider will prescribe medicines to treat the reaction (antihistamines). This is rare. HOME CARE INSTRUCTIONS  Do not scratch the bite area.  Keep the bite area clean and dry. Wash the bite area daily with soap and water as told by your health care provider.  If directed, applyice to the bite area.  Put ice in a plastic bag.  Place a towel between your skin and the bag.  Leave the ice on for 20 minutes, 2-3 times per day.  To help reduce itching and swelling, try applying a baking soda paste, cortisone cream, or calamine lotion to the bite area as told by your health care provider.  Apply or take over-the-counter and prescription medicines only as told by your health care provider.  If you were prescribed an antibiotic medicine, use it as told by your health care provider. Do not stop using the antibiotic even if your condition improves.  Keep all follow-up visits as told by your health care provider. This is  important. PREVENTION   Use insect repellent. The best insect repellents contain:  DEET, picaridin, oil of lemon eucalyptus (OLE), or IR3535.  Higher amounts of an active ingredient.  When you are outdoors, wear clothing that covers your arms and legs.  Avoid opening windows that do not have window screens. SEEK MEDICAL CARE IF:  You have increased redness, swelling, or pain in the bite area.  You have a fever. SEEK IMMEDIATE MEDICAL CARE IF:   You have joint pain.   You have fluid, blood, or pus coming from the bite area.  You have a headache or neck pain.  You have unusual weakness.  You have a rash.  You have chest pain or shortness of breath.  You have abdominal pain, nausea, or vomiting.  You feel unusually tired or sleepy.   This information is not intended to replace advice given to you by your health care provider. Make sure you discuss any questions you have with your health care provider.   Document Released: 02/01/2004 Document Revised: 09/14/2014 Document Reviewed: 05/11/2014 Elsevier Interactive Patient Education 2016 [employer]  

## 2015-05-25 NOTE — ED Provider Notes (Signed)
[facility] Emergency Department Provider Note  ____________________________________________  Time seen: Approximately 8:08 PM  I have reviewed the triage vital signs and the nursing notes.   HISTORY  Chief Complaint Rash    HPI Angela Friedman is a 30 y.o. female , NAD, presents to the emergency department with one-day history of rash to the right elbow. States she was bitten by an insect yesterday and when she woke this morning the area was more red and inflamed. She went to the health Department for evaluation and was told to take Benadryl. She has taken Benadryl around 10 AM this morning with no relief in symptoms. She is [redacted] weeks pregnant but is not having any abdominal pain, pelvic pain, vaginal discharge or bleeding. She states she does have nausea in which her OB/GYN has given her Zofran to take as needed. Denies fever, chills, body aches. No oozing or weeping about the elbow. Denies any difficulty breathing, swelling about the lips, tongue, throat. Has not had any wheezing or chest pain.   Past Medical History  Diagnosis Date  . Asthma   . Hypertension     There are no active problems to display for this patient.   History reviewed. No pertinent past surgical history.  Current Outpatient Rx  Name  Route  Sig  Dispense  Refill  . albuterol (PROVENTIL HFA;VENTOLIN HFA) 108 (90 BASE) MCG/ACT inhaler   Inhalation   Inhale 2 puffs into the lungs 2 (two) times daily as needed. As needed for shortness of breath.         . cephALEXin (KEFLEX) 500 MG capsule   Oral   Take 1 capsule (500 mg total) by mouth 3 (three) times daily.   21 capsule   0   . cyclobenzaprine (FLEXERIL) 10 MG tablet   Oral   Take 1 tablet (10 mg total) by mouth 3 (three) times daily as needed for muscle spasms.   15 tablet   1   . ibuprofen (ADVIL,MOTRIN) 200 MG tablet   Oral   Take 400 mg by mouth every 6 (six) hours as needed. For pain         . triamcinolone cream  (KENALOG) 0.1 %   Topical   Apply 1 application topically 2 (two) times daily.   30 g   0     Allergies Review of patient's allergies indicates no known allergies.  History reviewed. No pertinent family history.  Social History Social History  Substance Use Topics  . Smoking status: Never Smoker   . Smokeless tobacco: None  . Alcohol Use: No     Review of Systems  Constitutional: No fever/chills ENT: No swelling about lips, tongue, throat. Cardiovascular: No chest pain or palpitations. Respiratory: No cough. No shortness of breath. No wheezing.  Gastrointestinal: Positive nausea but no vomiting. No abdominal pain.  No vomiting Genitourinary: Negative for vaginal pain, discharge, bleeding. Musculoskeletal: Negative for back pain.  Skin: Positive insect bite with surrounding erythema about right elbow. Neurological: Negative for headaches, focal weakness or numbness. 10-point ROS otherwise negative.  ____________________________________________   PHYSICAL EXAM:  VITAL SIGNS: ED Triage Vitals  Enc Vitals Group     BP 05/25/15 1902 159/107 mmHg     Pulse Rate 05/25/15 1902 108     Resp 05/25/15 1902 22     Temp 05/25/15 1902 98.2 F (36.8 C)     Temp Source 05/25/15 1902 Oral     SpO2 05/25/15 1902 99 %  Weight 05/25/15 1902 217 lb (98.431 kg)     Height 05/25/15 1902  (1.626 m)     Head Cir --      Peak Flow --      Pain Score 05/25/15 1902 8     Pain Loc --      Pain Edu? --      Excl. in GC? --      Constitutional: Alert and oriented. Well appearing and in no acute distress. Eyes: Conjunctivae are normal. Head: Atraumatic. Neck: Supple with full range of motion Hematological/Lymphatic/Immunilogical: No cervical lymphadenopathy. Cardiovascular: Normal rate, regular rhythm. Normal S1 and S2.  Good peripheral circulationWith 2+ pulses noted in the right upper extremity. Capillary refill is brisk in all digits of the right upper  extremity. Respiratory: Normal respiratory effort without tachypnea or retractions. Lungs CTABWith breath sounds noted in all lung fields Musculoskeletal: Full range of motion of the right upper extremity to include the shoulder, elbow, wrist, hand, fingers without pain. No lower extremity tenderness nor edema.  No joint effusions. Neurologic:  Normal speech and language. No gross focal neurologic deficits are appreciated.  Skin:  Insect bite to the right lateral elbow with surrounding erythema and trace warmth noted. No evidence of induration nor open wounds or skin sores in the area. Skin is warm, dry and intact.  Psychiatric: Mood and affect are normal. Speech and behavior are normal. Patient exhibits appropriate insight and judgement.   ____________________________________________   LABS (all labs ordered are listed, but only abnormal results are displayed)  Labs Reviewed  CBC WITH DIFFERENTIAL/PLATELET - Abnormal; Notable for the following:    WBC 13.5 (*)    Hemoglobin 11.3 (*)    HCT 33.5 (*)    RDW 15.3 (*)    Neutro Abs 10.1 (*)    All other components within normal limits   ____________________________________________  EKG  None ____________________________________________  RADIOLOGY  None ____________________________________________    PROCEDURES  Procedure(s) performed: None    Medications - No data to display   ____________________________________________   INITIAL IMPRESSION / ASSESSMENT AND PLAN / ED COURSE  Pertinent lab results that were available during my care of the patient were reviewed by me and considered in my medical decision making (see chart for details).  Patient's diagnosis is consistent with Insect bite of right arm. Patient's white count was slightly elevated and with evidence of anemia which can be normal in pregnancy. Patient will be discharged home with prescriptions for Keflex to take as directed to cover for any potential  infection. Patient was also given a prescription for triamcinolone cream to apply only in a thin layer to the affected area as needed for no more than 7 days. Patient is to follow up with her primary care provider if symptoms persist past this treatment course. Patient is given ED precautions to return to the ED for any worsening or new symptoms.      ____________________________________________  FINAL CLINICAL IMPRESSION(S) / ED DIAGNOSES  Final diagnoses:  Insect bite of arm, right, initial encounter      NEW MEDICATIONS STARTED DURING THIS VISIT:  New Prescriptions   CEPHALEXIN (KEFLEX) 500 MG CAPSULE    Take 1 capsule (500 mg total) by mouth 3 (three) times daily.   TRIAMCINOLONE CREAM (KENALOG) 0.1 %    Apply 1 application topically 2 (two) times daily.         Hope Pigeon, PA-C 05/28/15 [id number]  Gayla Doss, MD 05/29/15 [phone]

## 2015-05-25 NOTE — ED Notes (Signed)
Pt presents with rash and redness to her right elbow and arm. States she was bitten by an insect yesterday and went to the health dept today. They told her to take benadryl and she did, with no relief.

## 2015-05-25 NOTE — ED Notes (Signed)
  Reviewed d/c instructions, follow-up care, and prescriptions with pt. Pt verbalized understanding 

## 2015-05-25 NOTE — ED Notes (Signed)
Pt has rash on right arm - it is itching and starting to swell per pt with intermittent pain to the area

## 2016-02-18 ENCOUNTER — Emergency Department: Payer: Medicaid Other

## 2016-02-18 ENCOUNTER — Emergency Department
Admission: EM | Admit: 2016-02-18 | Discharge: 2016-02-18 | Disposition: A | Discharge status: Home / Self Care | Payer: Medicaid Other | Attending: Emergency Medicine | Admitting: Emergency Medicine

## 2016-02-18 ENCOUNTER — Encounter: Payer: Self-pay | Admitting: Emergency Medicine

## 2016-02-18 DIAGNOSIS — I10 Essential (primary) hypertension: Secondary | ICD-10-CM | POA: Diagnosis not present

## 2016-02-18 DIAGNOSIS — Z79899 Other long term (current) drug therapy: Secondary | ICD-10-CM | POA: Insufficient documentation

## 2016-02-18 DIAGNOSIS — J45901 Unspecified asthma with (acute) exacerbation: Secondary | ICD-10-CM | POA: Diagnosis not present

## 2016-02-18 DIAGNOSIS — R0602 Shortness of breath: Secondary | ICD-10-CM | POA: Diagnosis present

## 2016-02-18 LAB — INFLUENZA PANEL BY PCR (TYPE A & B)
INFLBPCR: NEGATIVE
Influenza A By PCR: NEGATIVE

## 2016-02-18 MED ORDER — PREDNISONE 20 MG PO TABS: 60.0000 mg | ORAL_TABLET | Freq: Every day | ORAL | 0 refills | Status: AC

## 2016-02-18 MED ORDER — ALBUTEROL SULFATE (2.5 MG/3ML) 0.083% IN NEBU: 2.5000 mg | INHALATION_SOLUTION | Freq: Four times a day (QID) | RESPIRATORY_TRACT | 12 refills | Status: AC | PRN

## 2016-02-18 MED ORDER — ALBUTEROL SULFATE HFA 108 (90 BASE) MCG/ACT IN AERS
2.0000 | INHALATION_SPRAY | Freq: Four times a day (QID) | RESPIRATORY_TRACT | 2 refills | Status: AC | PRN
Start: 2016-02-18 — End: ?

## 2016-02-18 MED ORDER — ALBUTEROL SULFATE (2.5 MG/3ML) 0.083% IN NEBU
2.5000 mg | INHALATION_SOLUTION | Freq: Once | RESPIRATORY_TRACT | Status: AC
  Administered 2016-02-18: 2.5 mg via RESPIRATORY_TRACT
  Filled 2016-02-18: qty 3

## 2016-02-18 NOTE — ED Triage Notes (Signed)
Pt from home via [facility]; per EMS pt was found to have O2 sats in 90% on RA, after 1 albuterol, 2 duonebs and 125 of solumedrol, pt's O2 sats at 99%; pt has hx of asthma, and htn; Per EMS pt's bp was 145/102

## 2016-02-18 NOTE — ED Provider Notes (Signed)
[facility] Emergency Department Provider Note   First MD Initiated Contact with Patient 02/18/16 0254     (approximate)  I have reviewed the triage vital signs and the nursing notes.   HISTORY  Chief Complaint Shortness of Breath    HPI Angela Friedman is a 31 y.o. female with history of asthma presents to the emergency department via EMS with cough and wheezing times one day. Patient denies any fever no chest pain. Patient afebrile on presentation temperature 98.9. Of note patient received 2 DuoNeb's 125 mg of IV Solu-Medrol before arrival. Patient states her symptoms are much improved at this time.   Past Medical History:  Diagnosis Date  . Asthma   . Hypertension     There are no active problems to display for this patient.   History reviewed. No pertinent surgical history.  Prior to Admission medications   Medication Sig Start Date End Date Taking? Authorizing Provider  albuterol (PROVENTIL HFA;VENTOLIN HFA) 108 (90 BASE) MCG/ACT inhaler Inhale 2 puffs into the lungs 2 (two) times daily as needed. As needed for shortness of breath.    Historical Provider, MD  albuterol (PROVENTIL HFA;VENTOLIN HFA) 108 (90 Base) MCG/ACT inhaler Inhale 2 puffs into the lungs every 6 (six) hours as needed for wheezing or shortness of breath. 02/18/16   Darci Current, MD  albuterol (PROVENTIL) (2.5 MG/3ML) 0.083% nebulizer solution Take 3 mLs (2.5 mg total) by nebulization every 6 (six) hours as needed for wheezing or shortness of breath. 02/18/16   Darci Current, MD  cephALEXin (KEFLEX) 500 MG capsule Take 1 capsule (500 mg total) by mouth 3 (three) times daily. 05/25/15   Jami L Hagler, PA-C  cyclobenzaprine (FLEXERIL) 10 MG tablet Take 1 tablet (10 mg total) by mouth 3 (three) times daily as needed for muscle spasms. 07/29/14   Myrna Blazer, MD  ibuprofen (ADVIL,MOTRIN) 200 MG tablet Take 400 mg by mouth every 6 (six) hours as needed. For pain     Historical Provider, MD  predniSONE (DELTASONE) 20 MG tablet Take 3 tablets (60 mg total) by mouth daily. 02/18/16 02/23/16  Darci Current, MD  triamcinolone cream (KENALOG) 0.1 % Apply 1 application topically 2 (two) times daily. 05/25/15   Jami L Hagler, PA-C    Allergies Patient has no known allergies.  Family History  Problem Relation Age of Onset  . Heart failure Father     Social History Social History  Substance Use Topics  . Smoking status: Never Smoker  . Smokeless tobacco: Never Used  . Alcohol use No    Review of Systems Constitutional: No fever/chills Eyes: No visual changes. ENT: No sore throat. Cardiovascular: Denies chest pain. Respiratory: Positive for cough and wheezing. Gastrointestinal: No abdominal pain.  No nausea, no vomiting.  No diarrhea.  No constipation. Genitourinary: Negative for dysuria. Musculoskeletal: Negative for back pain. Skin: Negative for rash. Neurological: Negative for headaches, focal weakness or numbness.  10-point ROS otherwise negative.  ____________________________________________   PHYSICAL EXAM:  VITAL SIGNS: ED Triage Vitals  Enc Vitals Group     BP 02/18/16 0258 (!) 138/100     Pulse Rate 02/18/16 0258 (!) 101     Resp 02/18/16 0258 (!) 24     Temp 02/18/16 0258 98.9 F (37.2 C)     Temp Source 02/18/16 0258 Oral     SpO2 02/18/16 0239 99 %     Weight 02/18/16 0259 215 lb (97.5 kg)  Height 02/18/16 0259 5\' 5"  (1.651 m)     Head Circumference --      Peak Flow --      Pain Score --      Pain Loc --      Pain Edu? --      Excl. in GC? --     Constitutional: Alert and oriented. Well appearing and in no acute distress. Eyes: Conjunctivae are normal. PERRL. EOMI. Head: Atraumatic. Mouth/Throat: Mucous membranes are moist Neck: No stridor. Cardiovascular: Normal rate, regular rhythm. Good peripheral circulation. Grossly normal heart sounds. Respiratory: Normal respiratory effort.  No retractions.Mild  expiratory wheezes Gastrointestinal: Soft and nontender. No distention.  Musculoskeletal: No lower extremity tenderness nor edema. No gross deformities of extremities. Neurologic:  Normal speech and language. No gross focal neurologic deficits are appreciated.  Skin:  Skin is warm, dry and intact. No rash noted. Psychiatric: Mood and affect are normal. Speech and behavior are normal.  ____________________________________________   LABS (all labs ordered are listed, but only abnormal results are displayed)  Labs Reviewed  INFLUENZA PANEL BY PCR (TYPE A & B)   _  RADIOLOGY I, Moorcroft N BROWN, personally viewed and evaluated these images (plain radiographs) as part of my medical decision making, as well as reviewing the written report by the radiologist.  Final result by Adrian ProwsKim M Fujinaga, MD (02/18/16 03:23:17)           Narrative:   CLINICAL DATA: Hypoxic  EXAM: CHEST 2 VIEW  COMPARISON: 02/20/2011  FINDINGS: The heart size and mediastinal contours are within normal limits. Both lungs are clear. The visualized skeletal structures are unremarkable.  IMPRESSION: No active cardiopulmonary disease.   Electronically Signed By: Jasmine PangKim Fujinaga M.D. On: 02/18/2016 03:23           Procedures  _______   INITIAL IMPRESSION / ASSESSMENT AND PLAN / ED COURSE  Pertinent labs & imaging results that were available during my care of the patient were reviewed by me and considered in my medical decision making (see chart for details).  Patient received additional ureteral treatment in the emergency department with complete resolution of wheezing. Patient will be prescribed albuterol for home as well as prednisone.    ____________________________________________  FINAL CLINICAL IMPRESSION(S) / ED DIAGNOSES  Final diagnoses:  Moderate asthma with exacerbation, unspecified whether persistent     MEDICATIONS GIVEN DURING THIS VISIT:  Medications  albuterol  (PROVENTIL) (2.5 MG/3ML) 0.083% nebulizer solution 2.5 mg (2.5 mg Nebulization Given 02/18/16 0425)     NEW OUTPATIENT MEDICATIONS STARTED DURING THIS VISIT:  New Prescriptions   ALBUTEROL (PROVENTIL HFA;VENTOLIN HFA) 108 (90 BASE) MCG/ACT INHALER    Inhale 2 puffs into the lungs every 6 (six) hours as needed for wheezing or shortness of breath.   ALBUTEROL (PROVENTIL) (2.5 MG/3ML) 0.083% NEBULIZER SOLUTION    Take 3 mLs (2.5 mg total) by nebulization every 6 (six) hours as needed for wheezing or shortness of breath.   PREDNISONE (DELTASONE) 20 MG TABLET    Take 3 tablets (60 mg total) by mouth daily.    Modified Medications   No medications on file    Discontinued Medications   No medications on file     Note:  This document was prepared using Dragon voice recognition software and may include unintentional dictation errors.    Darci Currentandolph N Brown, MD 02/18/16 [phone]

## 2016-07-07 ENCOUNTER — Emergency Department: Payer: No Typology Code available for payment source

## 2016-07-07 ENCOUNTER — Encounter: Payer: Self-pay | Admitting: Emergency Medicine

## 2016-07-07 ENCOUNTER — Emergency Department
Admission: EM | Admit: 2016-07-07 | Discharge: 2016-07-07 | Disposition: A | Discharge status: Home / Self Care | Payer: No Typology Code available for payment source | Attending: Emergency Medicine | Admitting: Emergency Medicine

## 2016-07-07 DIAGNOSIS — S3992XA Unspecified injury of lower back, initial encounter: Secondary | ICD-10-CM | POA: Diagnosis present

## 2016-07-07 DIAGNOSIS — S32000A Wedge compression fracture of unspecified lumbar vertebra, initial encounter for closed fracture: Secondary | ICD-10-CM | POA: Diagnosis not present

## 2016-07-07 DIAGNOSIS — Y999 Unspecified external cause status: Secondary | ICD-10-CM | POA: Diagnosis not present

## 2016-07-07 DIAGNOSIS — J45909 Unspecified asthma, uncomplicated: Secondary | ICD-10-CM | POA: Insufficient documentation

## 2016-07-07 DIAGNOSIS — Y92410 Unspecified street and highway as the place of occurrence of the external cause: Secondary | ICD-10-CM | POA: Insufficient documentation

## 2016-07-07 DIAGNOSIS — IMO0002 Reserved for concepts with insufficient information to code with codable children: Secondary | ICD-10-CM

## 2016-07-07 DIAGNOSIS — M791 Myalgia: Secondary | ICD-10-CM | POA: Insufficient documentation

## 2016-07-07 DIAGNOSIS — Y939 Activity, unspecified: Secondary | ICD-10-CM | POA: Insufficient documentation

## 2016-07-07 DIAGNOSIS — I10 Essential (primary) hypertension: Secondary | ICD-10-CM | POA: Insufficient documentation

## 2016-07-07 DIAGNOSIS — M7918 Myalgia, other site: Secondary | ICD-10-CM

## 2016-07-07 MED ORDER — CYCLOBENZAPRINE HCL 5 MG PO TABS: 5.0000 mg | ORAL_TABLET | Freq: Three times a day (TID) | ORAL | 0 refills | Status: AC | PRN
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
## 2016-07-07 NOTE — ED Notes (Signed)
Patient transported to X-ray 

## 2017-05-26 ENCOUNTER — Emergency Department: Payer: Self-pay

## 2017-05-26 ENCOUNTER — Encounter: Payer: Self-pay | Admitting: Medical Oncology

## 2017-05-26 ENCOUNTER — Emergency Department
Admission: EM | Admit: 2017-05-26 | Discharge: 2017-05-26 | Disposition: A | Discharge status: Home / Self Care | Payer: Self-pay | Attending: Student in an Organized Health Care Education/Training Program | Admitting: Student in an Organized Health Care Education/Training Program

## 2017-05-26 DIAGNOSIS — M7989 Other specified soft tissue disorders: Secondary | ICD-10-CM | POA: Insufficient documentation

## 2017-05-26 DIAGNOSIS — M79605 Pain in left leg: Secondary | ICD-10-CM | POA: Insufficient documentation

## 2017-05-26 DIAGNOSIS — I10 Essential (primary) hypertension: Secondary | ICD-10-CM | POA: Insufficient documentation

## 2017-05-26 DIAGNOSIS — J45909 Unspecified asthma, uncomplicated: Secondary | ICD-10-CM | POA: Insufficient documentation

## 2017-05-26 DIAGNOSIS — R079 Chest pain, unspecified: Secondary | ICD-10-CM | POA: Insufficient documentation

## 2017-05-26 LAB — FIBRIN DERIVATIVES D-DIMER (ARMC ONLY): Fibrin derivatives D-dimer (ARMC): 316.95 ng/mL (FEU) (ref 0.00–499.00)

## 2017-05-26 LAB — CBC
HCT: 35.4 % (ref 35.0–47.0)
Hemoglobin: 11.8 g/dL — ABNORMAL LOW (ref 12.0–16.0)
MCH: 25.2 pg — ABNORMAL LOW (ref 26.0–34.0)
MCHC: 33.4 g/dL (ref 32.0–36.0)
MCV: 75.4 fL — AB (ref 80.0–100.0)
Platelets: 323 10*3/uL (ref 150–440)
RBC: 4.69 MIL/uL (ref 3.80–5.20)
RDW: 18.1 % — AB (ref 11.5–14.5)
WBC: 8.3 10*3/uL (ref 3.6–11.0)

## 2017-05-26 LAB — BASIC METABOLIC PANEL
Anion gap: 8 (ref 5–15)
BUN: 11 mg/dL (ref 6–20)
CHLORIDE: 106 mmol/L (ref 101–111)
CO2: 27 mmol/L (ref 22–32)
Calcium: 8.8 mg/dL — ABNORMAL LOW (ref 8.9–10.3)
Creatinine, Ser: 0.7 mg/dL (ref 0.44–1.00)
GFR calc Af Amer: >60 mL/min (ref >60)
GFR calc non Af Amer: >60 mL/min (ref >60)
GLUCOSE: 107 mg/dL — AB (ref 65–99)
Potassium: 3.1 mmol/L — ABNORMAL LOW (ref 3.5–5.1)
Sodium: 141 mmol/L (ref 135–145)

## 2017-05-26 LAB — TROPONIN I: Troponin I: <0.03 ng/mL (ref <0.03)

## 2017-05-26 LAB — POCT PREGNANCY, URINE: Preg Test, Ur: NEGATIVE

## 2017-05-26 MED ORDER — HYDROCODONE-ACETAMINOPHEN 5-325 MG PO TABS
1.0000 | ORAL_TABLET | Freq: Once | ORAL | Status: AC
  Administered 2017-05-26: 1 via ORAL
  Filled 2017-05-26: qty 1

## 2017-05-26 MED ORDER — ALBUTEROL SULFATE HFA 108 (90 BASE) MCG/ACT IN AERS: 2.0000 | INHALATION_SPRAY | Freq: Four times a day (QID) | RESPIRATORY_TRACT | 2 refills | Status: DC | PRN

## 2017-05-26 MED ORDER — ALBUTEROL SULFATE HFA 108 (90 BASE) MCG/ACT IN AERS: 2.0000 | INHALATION_SPRAY | Freq: Four times a day (QID) | RESPIRATORY_TRACT | 2 refills | Status: AC | PRN

## 2017-05-26 MED ORDER — TRAMADOL HCL 50 MG PO TABS: 50.0000 mg | ORAL_TABLET | Freq: Four times a day (QID) | ORAL | 0 refills | Status: DC | PRN

## 2017-05-26 MED ORDER — TRAMADOL HCL 50 MG PO TABS: 50.0000 mg | ORAL_TABLET | Freq: Four times a day (QID) | ORAL | 0 refills | Status: AC | PRN

## 2017-05-26 NOTE — ED Notes (Addendum)
Patient reports pain in left arm, running down left side x3 weeks. Denies any known injury. Pain worse with ambulation. Swelling noted to left ankle.

## 2017-05-26 NOTE — ED Provider Notes (Addendum)
[facility] Emergency Department Provider Note    First MD Initiated Contact with Patient 05/26/17 2018     (approximate)  I have reviewed the triage vital signs and the nursing notes.   HISTORY  Chief Complaint Chest Pain; Arm Pain; and Foot Pain   HPI Angela Friedman is a 32 y.o. female presents with 3 of complaint of left ankle pain as well as intermittent episodes of chest pain particularly with ambulation.  Denies any cough.  States the pain is midsternal nonradiating.  Denies any pain at this time.  Does have a history of hypertension and has been diagnosed with pleurisy as well as eczema in the past.  States this feels slightly different.  Not on any blood thinners.  No recent prolonged travel.  No fevers.  States the pain is worse when she leans forward.  Denies any trauma to the ankle.  Pain is rated as mild to moderate.  Past Medical History:  Diagnosis Date  . Asthma   . Hypertension    Family History  Problem Relation Age of Onset  . Heart failure Father    No past surgical history on file. There are no active problems to display for this patient.     Prior to Admission medications   Medication Sig Start Date End Date Taking? Authorizing Provider  albuterol (PROVENTIL HFA;VENTOLIN HFA) 108 (90 BASE) MCG/ACT inhaler Inhale 2 puffs into the lungs 2 (two) times daily as needed. As needed for shortness of breath.    [provider]  albuterol (PROVENTIL HFA;VENTOLIN HFA) 108 (90 Base) MCG/ACT inhaler Inhale 2 puffs into the lungs every 6 (six) hours as needed for wheezing or shortness of breath. 02/18/16   Darci Current, MD  albuterol (PROVENTIL) (2.5 MG/3ML) 0.083% nebulizer solution Take 3 mLs (2.5 mg total) by nebulization every 6 (six) hours as needed for wheezing or shortness of breath. 02/18/16   Darci Current, MD  cephALEXin (KEFLEX) 500 MG capsule Take 1 capsule (500 mg total) by mouth 3 (three) times daily. 05/25/15    Hagler, Jami L, PA-C  ibuprofen (ADVIL,MOTRIN) 200 MG tablet Take 400 mg by mouth every 6 (six) hours as needed. For pain    [provider]  triamcinolone cream (KENALOG) 0.1 % Apply 1 application topically 2 (two) times daily. 05/25/15   Hagler, Jami L, PA-C    Allergies Patient has no known allergies.    Social History Social History   Tobacco Use  . Smoking status: Never Smoker  . Smokeless tobacco: Never Used  Substance Use Topics  . Alcohol use: No  . Drug use: Not on file    Review of Systems Patient denies headaches, rhinorrhea, blurry vision, numbness, shortness of breath, chest pain, edema, cough, abdominal pain, nausea, vomiting, diarrhea, dysuria, fevers, rashes or hallucinations unless otherwise stated above in HPI. ____________________________________________   PHYSICAL EXAM:  VITAL SIGNS: Vitals:   05/26/17 1827  BP: (!) 162/95  Pulse: (!) 102  Resp: 16  Temp: 99 F (37.2 C)  SpO2: 98%    Constitutional: Alert and oriented. Well appearing and in no acute distress. Eyes: Conjunctivae are normal.  Head: Atraumatic. Nose: No congestion/rhinnorhea. Mouth/Throat: Mucous membranes are moist.   Neck: No stridor. Painless ROM.  Cardiovascular: Normal rate, regular rhythm. Grossly normal heart sounds.  Good peripheral circulation. Respiratory: Normal respiratory effort.  No retractions. Lungs CTAB. Gastrointestinal: Soft and nontender. No distention. No abdominal bruits. No CVA tenderness. Genitourinary:  Musculoskeletal: No  lower extremity tenderness trace lle edema.  No joint effusions.  Painless full ROM of BLE.  Strong DP and PT pulses bilaterally Neurologic:  Normal speech and language. No gross focal neurologic deficits are appreciated. No facial droop Skin:  Skin is warm, dry and intact. No rash noted. Psychiatric: Mood and affect are normal. Speech and behavior are normal.  ____________________________________________   LABS (all labs  ordered are listed, but only abnormal results are displayed)  Results for orders placed or performed during the hospital encounter of 05/26/17 (from the past 24 hour(s))  Basic metabolic panel     Status: Abnormal   Collection Time: 05/26/17  6:36 PM  Result Value Ref Range   Sodium 141 135 - 145 mmol/L   Potassium 3.1 (L) 3.5 - 5.1 mmol/L   Chloride 106 101 - 111 mmol/L   CO2 27 22 - 32 mmol/L   Glucose, Bld 107 (H) 65 - 99 mg/dL   BUN 11 6 - 20 mg/dL   Creatinine, Ser 1.61 0.44 - 1.00 mg/dL   Calcium 8.8 (L) 8.9 - 10.3 mg/dL   GFR calc non Af Amer >60 >60 mL/min   GFR calc Af Amer >60 >60 mL/min   Anion gap 8 5 - 15  CBC     Status: Abnormal   Collection Time: 05/26/17  6:36 PM  Result Value Ref Range   WBC 8.3 3.6 - 11.0 K/uL   RBC 4.69 3.80 - 5.20 MIL/uL   Hemoglobin 11.8 (L) 12.0 - 16.0 g/dL   HCT 09.6 04.5 - 40.9 %   MCV 75.4 (L) 80.0 - 100.0 fL   MCH 25.2 (L) 26.0 - 34.0 pg   MCHC 33.4 32.0 - 36.0 g/dL   RDW 81.1 (H) 91.4 - 78.2 %   Platelets 323 150 - 440 K/uL  Troponin I     Status: None   Collection Time: 05/26/17  6:36 PM  Result Value Ref Range   Troponin I <0.03 <0.03 ng/mL   ____________________________________________  EKG My review and personal interpretation at Time: 18:23   Indication: chest pain  Rate: 99  Rhythm: sinus Axis: normal Other: normal intervals, no stemi, occasional pvc ____________________________________________  RADIOLOGY  I personally reviewed all radiographic images ordered to evaluate for the above acute complaints and reviewed radiology reports and findings.  These findings were personally discussed with the patient.  Please see medical record for radiology report.  ____________________________________________   PROCEDURES  Procedure(s) performed:  Procedures    Critical Care performed: no ____________________________________________   INITIAL IMPRESSION / ASSESSMENT AND PLAN / ED COURSE  Pertinent labs & imaging  results that were available during my care of the patient were reviewed by me and considered in my medical decision making (see chart for details).  DDX: ACS, pericarditis,  pe, dissection, pna, bronchitis, costochondritis   Angela Friedman is a 32 y.o. who presents to the ED with was as described above.  Patient in no acute distress.  No evidence of active chest pain at this time.  EKG shows no evidence of acute ischemia and her troponin is negative.  Otherwise very low risk for chest pain.  With her leg swelling ultrasound ordered to evaluate for DVT shows no evidence of DVT.  No evidence of infectious or septic process.  D-dimer is negative.  At this point do believe patient stable and appropriate for outpatient follow-up.  Abdominal exam soft and benign.  Patient was able to tolerate PO and was able to ambulate  with a steady gait.  Have discussed with the patient and available family all diagnostics and treatments performed thus far and all questions were answered to the best of my ability. The patient demonstrates understanding and agreement with plan.       As part of my medical decision making, I reviewed the following data within the electronic MEDICAL RECORD NUMBER Nursing notes reviewed and incorporated, Labs reviewed, notes from prior ED visits.   ____________________________________________   FINAL CLINICAL IMPRESSION(S) / ED DIAGNOSES  Final diagnoses:  Chest pain, unspecified type  Leg swelling      NEW MEDICATIONS STARTED DURING THIS VISIT:  New Prescriptions   No medications on file     Note:  This document was prepared using Dragon voice recognition software and may include unintentional dictation errors.    Willy Eddy, MD 05/26/17 2252    Willy Eddy, MD 05/26/17 2256

## 2017-05-26 NOTE — ED Triage Notes (Signed)
Pt reports she began having left sided sharp chest pain last night, pt also reports pain into left arm, leg and foot.

## 2017-08-28 ENCOUNTER — Emergency Department
Admission: EM | Admit: 2017-08-28 | Discharge: 2017-08-29 | Disposition: A | Discharge status: Home / Self Care | Payer: Self-pay | Attending: Emergency Medicine | Admitting: Emergency Medicine

## 2017-08-28 ENCOUNTER — Encounter: Payer: Self-pay | Admitting: *Deleted

## 2017-08-28 ENCOUNTER — Emergency Department: Payer: Self-pay

## 2017-08-28 ENCOUNTER — Other Ambulatory Visit: Payer: Self-pay

## 2017-08-28 DIAGNOSIS — Z79899 Other long term (current) drug therapy: Secondary | ICD-10-CM | POA: Insufficient documentation

## 2017-08-28 DIAGNOSIS — E876 Hypokalemia: Secondary | ICD-10-CM | POA: Insufficient documentation

## 2017-08-28 DIAGNOSIS — I10 Essential (primary) hypertension: Secondary | ICD-10-CM | POA: Insufficient documentation

## 2017-08-28 DIAGNOSIS — R079 Chest pain, unspecified: Secondary | ICD-10-CM | POA: Insufficient documentation

## 2017-08-28 DIAGNOSIS — J45909 Unspecified asthma, uncomplicated: Secondary | ICD-10-CM | POA: Insufficient documentation

## 2017-08-28 LAB — BASIC METABOLIC PANEL
Anion gap: 8 (ref 5–15)
BUN: 10 mg/dL (ref 6–20)
CHLORIDE: 105 mmol/L (ref 98–111)
CO2: 26 mmol/L (ref 22–32)
Calcium: 8.6 mg/dL — ABNORMAL LOW (ref 8.9–10.3)
Creatinine, Ser: 0.72 mg/dL (ref 0.44–1.00)
GFR calc Af Amer: >60 mL/min (ref >60)
GFR calc non Af Amer: >60 mL/min (ref >60)
GLUCOSE: 168 mg/dL — AB (ref 70–99)
POTASSIUM: 2.8 mmol/L — AB (ref 3.5–5.1)
Sodium: 139 mmol/L (ref 135–145)

## 2017-08-28 LAB — CBC
HEMATOCRIT: 36.4 % (ref 35.0–47.0)
HEMOGLOBIN: 12.2 g/dL (ref 12.0–16.0)
MCH: 27.1 pg (ref 26.0–34.0)
MCHC: 33.5 g/dL (ref 32.0–36.0)
MCV: 81 fL (ref 80.0–100.0)
Platelets: 298 10*3/uL (ref 150–440)
RBC: 4.5 MIL/uL (ref 3.80–5.20)
RDW: 16.5 % — ABNORMAL HIGH (ref 11.5–14.5)
WBC: 7.8 10*3/uL (ref 3.6–11.0)

## 2017-08-28 LAB — TROPONIN I: Troponin I: <0.03 ng/mL (ref <0.03)

## 2017-08-28 MED ORDER — KETOROLAC TROMETHAMINE 30 MG/ML IJ SOLN
15.0000 mg | Freq: Once | INTRAMUSCULAR | Status: AC
  Administered 2017-08-28: 15 mg via INTRAVENOUS
  Filled 2017-08-28: qty 1

## 2017-08-28 NOTE — ED Triage Notes (Signed)
Pt has left side chest pain since 1600 today.  No cough.  No sob.  Nonsmoker.  No n/v/  Pt alert.   Speech clear.

## 2017-08-28 NOTE — ED Provider Notes (Signed)
[facility] Emergency Department Provider Note   ____________________________________________   First MD Initiated Contact with Patient 08/28/17 2317     (approximate)  I have reviewed the triage vital signs and the nursing notes.   HISTORY  Chief Complaint Chest Pain    HPI Angela Friedman is a 32 y.o. female who presents to the ED from home with a chief complaint of chest pain. Patient reports onset of sharp, central CP around 4pm while cooking. Describes waxing/waning pain since; currently 7/10. Denies associated diaphoresis, SOB, N/V, palpitations or dizziness. Denies recent fever, chills, abdominal pain, dysuria, diarrhea. Denies recent travel, trauma or hormone use.    Past Medical History:  Diagnosis Date  . Asthma   . Hypertension     There are no active problems to display for this patient.   No past surgical history on file.  Prior to Admission medications   Medication Sig Start Date End Date Taking? Authorizing Provider  albuterol (PROVENTIL HFA;VENTOLIN HFA) 108 (90 BASE) MCG/ACT inhaler Inhale 2 puffs into the lungs 2 (two) times daily as needed. As needed for shortness of breath.    [provider]  albuterol (PROVENTIL HFA;VENTOLIN HFA) 108 (90 Base) MCG/ACT inhaler Inhale 2 puffs into the lungs every 6 (six) hours as needed for wheezing or shortness of breath. 02/18/16   Darci CurrentBrown, [place] N, MD  albuterol (PROVENTIL HFA;VENTOLIN HFA) 108 (90 Base) MCG/ACT inhaler Inhale 2 puffs into the lungs every 6 (six) hours as needed for wheezing or shortness of breath. 05/26/17   Willy Eddyobinson, Patrick, MD  albuterol (PROVENTIL) (2.5 MG/3ML) 0.083% nebulizer solution Take 3 mLs (2.5 mg total) by nebulization every 6 (six) hours as needed for wheezing or shortness of breath. 02/18/16   Darci CurrentBrown, [place] N, MD  amLODipine (NORVASC) 5 MG tablet Take 1 tablet (5 mg total) by mouth daily. 08/29/17   Irean HongSung, Tameia Rafferty J, MD  cephALEXin (KEFLEX) 500 MG capsule  Take 1 capsule (500 mg total) by mouth 3 (three) times daily. 05/25/15   Hagler, Jami L, PA-C  ibuprofen (ADVIL,MOTRIN) 200 MG tablet Take 400 mg by mouth every 6 (six) hours as needed. For pain    [provider]  traMADol (ULTRAM) 50 MG tablet Take 1 tablet (50 mg total) by mouth every 6 (six) hours as needed. 05/26/17 05/26/18  Willy Eddyobinson, Patrick, MD  triamcinolone cream (KENALOG) 0.1 % Apply 1 application topically 2 (two) times daily. 05/25/15   Hagler, Jami L, PA-C    Allergies Patient has no known allergies.  Family History  Problem Relation Age of Onset  . Heart failure Father     Social History Social History   Tobacco Use  . Smoking status: Never Smoker  . Smokeless tobacco: Never Used  Substance Use Topics  . Alcohol use: No  . Drug use: Not on file    Review of Systems  Constitutional: No fever/chills Eyes: No visual changes. ENT: No sore throat. Cardiovascular: Positive for chest pain. Respiratory: Denies shortness of breath. Gastrointestinal: No abdominal pain.  No nausea, no vomiting.  No diarrhea.  No constipation. Genitourinary: Negative for dysuria. Musculoskeletal: Negative for back pain. Skin: Negative for rash. Neurological: Negative for headaches, focal weakness or numbness.   ____________________________________________   PHYSICAL EXAM:  VITAL SIGNS: ED Triage Vitals  Enc Vitals Group     BP 08/28/17 1948 (!) 147/105     Pulse Rate 08/28/17 1948 96     Resp 08/28/17 1948 18     Temp  08/28/17 1948 99.2 F (37.3 C)     Temp Source 08/28/17 1948 Oral     SpO2 08/28/17 1948 97 %     Weight 08/28/17 1946 227 lb (103 kg)     Height 08/28/17 1946 5\' 5"  (1.651 m)     Head Circumference --      Peak Flow --      Pain Score 08/28/17 1946 9     Pain Loc --      Pain Edu? --      Excl. in GC? --     Constitutional: Alert and oriented. Well appearing and in no acute distress. Eyes: Conjunctivae are normal. PERRL. EOMI. Head:  Atraumatic. Nose: No congestion/rhinnorhea. Mouth/Throat: Mucous membranes are moist.  Oropharynx non-erythematous. Neck: No stridor.   Cardiovascular: Normal rate, regular rhythm. Grossly normal heart sounds.  Good peripheral circulation. Respiratory: Normal respiratory effort.  No retractions. Lungs CTAB. Tender to palpation anterior chest. Gastrointestinal: Soft and nontender. No distention. No abdominal bruits. No CVA tenderness. Musculoskeletal: No lower extremity tenderness nor edema.  No joint effusions. Neurologic:  Normal speech and language. No gross focal neurologic deficits are appreciated. No gait instability. Skin:  Skin is warm, dry and intact. No rash noted. Psychiatric: Mood and affect are normal. Speech and behavior are normal.  ____________________________________________   LABS (all labs ordered are listed, but only abnormal results are displayed)  Labs Reviewed  BASIC METABOLIC PANEL - Abnormal; Notable for the following components:      Result Value   Potassium 2.8 (*)    Glucose, Bld 168 (*)    Calcium 8.6 (*)    All other components within normal limits  CBC - Abnormal; Notable for the following components:   RDW 16.5 (*)    All other components within normal limits  TROPONIN I  TROPONIN I  FIBRIN DERIVATIVES D-DIMER ([facility] ONLY)   ____________________________________________  EKG  ED ECG REPORT I, Jerae Izard J, the attending physician, personally viewed and interpreted this ECG.   Date: 08/28/2017  EKG Time: 1950  Rate: 91  Rhythm: normal EKG, normal sinus rhythm  Axis: Normal  Intervals:none  ST&T Change: Nonspecific  ____________________________________________  RADIOLOGY  ED MD interpretation:  No acute cardiopulmonary process  Official radiology report(s): Dg Chest 2 View  Result Date: 08/28/2017 CLINICAL DATA:  Acute LEFT chest pain for 1 day. EXAM: CHEST - 2 VIEW COMPARISON:  05/26/2017 and prior radiographs FINDINGS: The  cardiomediastinal silhouette is unremarkable. Mild elevation of the RIGHT hemidiaphragm noted. There is no evidence of focal airspace disease, pulmonary edema, suspicious pulmonary nodule/mass, pleural effusion, or pneumothorax. No acute bony abnormalities are identified. IMPRESSION: No active cardiopulmonary disease. Electronically Signed   By: Harmon Pier M.D.   On: 08/28/2017 20:09    ____________________________________________   PROCEDURES  Procedure(s) performed: None  Procedures  Critical Care performed: No  ____________________________________________   INITIAL IMPRESSION / ASSESSMENT AND PLAN / ED COURSE  As part of my medical decision making, I reviewed the following data within the electronic MEDICAL RECORD NUMBER History obtained from family, Nursing notes reviewed and incorporated, Labs reviewed, EKG interpreted, Old chart reviewed, Radiograph reviewed and Notes from prior ED visits   32 year old female who presents with chest pain. Differential diagnosis includes, but is not limited to, ACS, aortic dissection, pulmonary embolism, cardiac tamponade, pneumothorax, pneumonia, pericarditis, myocarditis, GI-related causes including esophagitis/gastritis, and musculoskeletal chest wall pain.    Laboratory and imaging results remarkable for hypokalemia. Will repeat timed troponin, add  D-dimer, give 15mg  IV Toradol for discomfort. Will reassess. Blood pressure noted; patient had PIH but had not taken any anti-hypertensives since.  Clinical Course as of Aug 29 516  Fri Aug 29, 2017  0118 Patient feeling significantly better.  Updated her of negative repeat troponin and negative d-dimer.  Will start her on antihypertensive daily and she will follow-up closely with her PCP.  Will also provide cardiology contact information for follow-up.  Discussed potassium repletion through foods.  Strict return precautions given.  Patient and spouse verbalize understanding and agree with plan of care.    [JS]    Clinical Course User Index [JS] Irean Hong, MD     ____________________________________________   FINAL CLINICAL IMPRESSION(S) / ED DIAGNOSES  Final diagnoses:  Hypokalemia  Nonspecific chest pain  Essential hypertension     ED Discharge Orders         Ordered    amLODipine (NORVASC) 5 MG tablet  Daily     08/29/17 0120           Note:  This document was prepared using Dragon voice recognition software and may include unintentional dictation errors.    Irean Hong, MD 08/29/17 [phone]

## 2017-08-29 LAB — TROPONIN I

## 2017-08-29 LAB — FIBRIN DERIVATIVES D-DIMER (ARMC ONLY): Fibrin derivatives D-dimer (ARMC): 270.17 ng/mL (FEU) (ref 0.00–499.00)

## 2017-08-29 MED ORDER — POTASSIUM CHLORIDE CRYS ER 20 MEQ PO TBCR
40.0000 meq | EXTENDED_RELEASE_TABLET | Freq: Once | ORAL | Status: AC
  Administered 2017-08-29: 40 meq via ORAL
  Filled 2017-08-29: qty 2

## 2017-08-29 MED ORDER — AMLODIPINE BESYLATE 5 MG PO TABS: 5.0000 mg | ORAL_TABLET | Freq: Every day | ORAL | 0 refills | Status: AC

## 2017-08-29 NOTE — Discharge Instructions (Signed)
1.  Start Norvasc 5 mg daily for blood pressure control. 2.  Increase your daily content of foods rich in potassium. 3.  Return to the ER for worsening symptoms, persistent vomiting, difficulty breathing or other concerns.

## 2017-10-28 ENCOUNTER — Other Ambulatory Visit: Payer: Self-pay

## 2017-10-28 ENCOUNTER — Emergency Department
Admission: EM | Admit: 2017-10-28 | Discharge: 2017-10-28 | Disposition: A | Discharge status: Home / Self Care | Payer: Self-pay | Attending: Emergency Medicine | Admitting: Emergency Medicine

## 2017-10-28 ENCOUNTER — Encounter: Payer: Self-pay | Admitting: Emergency Medicine

## 2017-10-28 ENCOUNTER — Emergency Department: Payer: Self-pay

## 2017-10-28 DIAGNOSIS — M722 Plantar fascial fibromatosis: Secondary | ICD-10-CM | POA: Insufficient documentation

## 2017-10-28 DIAGNOSIS — M2141 Flat foot [pes planus] (acquired), right foot: Secondary | ICD-10-CM | POA: Insufficient documentation

## 2017-10-28 DIAGNOSIS — I10 Essential (primary) hypertension: Secondary | ICD-10-CM | POA: Insufficient documentation

## 2017-10-28 DIAGNOSIS — M2142 Flat foot [pes planus] (acquired), left foot: Secondary | ICD-10-CM | POA: Insufficient documentation

## 2017-10-28 DIAGNOSIS — J45909 Unspecified asthma, uncomplicated: Secondary | ICD-10-CM | POA: Insufficient documentation

## 2017-10-28 DIAGNOSIS — Z79899 Other long term (current) drug therapy: Secondary | ICD-10-CM | POA: Insufficient documentation

## 2017-10-28 MED ORDER — NAPROXEN 500 MG PO TABS: 500.0000 mg | ORAL_TABLET | Freq: Two times a day (BID) | ORAL | Status: AC

## 2017-10-28 MED ORDER — NAPROXEN 500 MG PO TABS
500.0000 mg | ORAL_TABLET | Freq: Once | ORAL | Status: AC
  Administered 2017-10-28: 500 mg via ORAL
  Filled 2017-10-28: qty 1

## 2017-10-28 MED ORDER — TRAMADOL HCL 50 MG PO TABS: 50.0000 mg | ORAL_TABLET | Freq: Four times a day (QID) | ORAL | 0 refills | Status: AC | PRN

## 2017-10-28 MED ORDER — TRAMADOL HCL 50 MG PO TABS
50.0000 mg | ORAL_TABLET | Freq: Once | ORAL | Status: AC
  Administered 2017-10-28: 50 mg via ORAL
  Filled 2017-10-28: qty 1

## 2017-10-28 NOTE — ED Triage Notes (Signed)
Pt in via POV with complaints of left foot pain to inner arch, worsening over the last three weeks, states pain is worse after she sits for a while and then gets up.  Pt denies any recent injury.  Pt ambulatory to triage, NAD noted at this time.

## 2017-10-28 NOTE — ED Notes (Signed)
See triage note  Presents with pain to left foot  States pain started about 3 weeks ago w/o injury  Pain is mainly to lateral foot and across top of foot  Good pulses

## 2017-10-28 NOTE — ED Provider Notes (Signed)
[facility] Emergency Department Provider Note   ____________________________________________   First MD Initiated Contact with Patient 10/28/17 1206     (approximate)  I have reviewed the triage vital signs and the nursing notes.   HISTORY  Chief Complaint Foot Pain    HPI Angela Friedman is a 32 y.o. female patient complain of 3 weeks of increasing left foot pain which started at the plantar heel and radiates to the arch of the foot.  Patient state pain increases after she been sitting for a while and get up to move.  Patient denies any provocative incident.  Patient states similar pain in the right foot but not as severe as the left.  Patient rates the pain as a 9/10.  Patient described pain is "achy".  No palliative measure for complaint.  Past Medical History:  Diagnosis Date  . Asthma   . Hypertension     There are no active problems to display for this patient.   History reviewed. No pertinent surgical history.  Prior to Admission medications   Medication Sig Start Date End Date Taking? Authorizing Provider  albuterol (PROVENTIL HFA;VENTOLIN HFA) 108 (90 BASE) MCG/ACT inhaler Inhale 2 puffs into the lungs 2 (two) times daily as needed. As needed for shortness of breath.    [provider]  albuterol (PROVENTIL HFA;VENTOLIN HFA) 108 (90 Base) MCG/ACT inhaler Inhale 2 puffs into the lungs every 6 (six) hours as needed for wheezing or shortness of breath. 02/18/16   Darci Current, MD  albuterol (PROVENTIL HFA;VENTOLIN HFA) 108 (90 Base) MCG/ACT inhaler Inhale 2 puffs into the lungs every 6 (six) hours as needed for wheezing or shortness of breath. 05/26/17   Willy Eddy, MD  albuterol (PROVENTIL) (2.5 MG/3ML) 0.083% nebulizer solution Take 3 mLs (2.5 mg total) by nebulization every 6 (six) hours as needed for wheezing or shortness of breath. 02/18/16   Darci Current, MD  amLODipine (NORVASC) 5 MG tablet Take 1 tablet (5 mg total)  by mouth daily. 08/29/17   Irean Hong, MD  cephALEXin (KEFLEX) 500 MG capsule Take 1 capsule (500 mg total) by mouth 3 (three) times daily. 05/25/15   Hagler, Jami L, PA-C  ibuprofen (ADVIL,MOTRIN) 200 MG tablet Take 400 mg by mouth every 6 (six) hours as needed. For pain    [provider]  naproxen (NAPROSYN) 500 MG tablet Take 1 tablet (500 mg total) by mouth 2 (two) times daily with a meal. 10/28/17   Joni Reining, PA-C  traMADol (ULTRAM) 50 MG tablet Take 1 tablet (50 mg total) by mouth every 6 (six) hours as needed. 05/26/17 05/26/18  Willy Eddy, MD  traMADol (ULTRAM) 50 MG tablet Take 1 tablet (50 mg total) by mouth every 6 (six) hours as needed. 10/28/17 10/28/18  Joni Reining, PA-C  triamcinolone cream (KENALOG) 0.1 % Apply 1 application topically 2 (two) times daily. 05/25/15   Hagler, Jami L, PA-C    Allergies Patient has no known allergies.  Family History  Problem Relation Age of Onset  . Heart failure Father     Social History Social History   Tobacco Use  . Smoking status: Never Smoker  . Smokeless tobacco: Never Used  Substance Use Topics  . Alcohol use: No  . Drug use: Never    Review of Systems  Constitutional: No fever/chills Eyes: No visual changes. ENT: No sore throat. Cardiovascular: Denies chest pain. Respiratory: Denies shortness of breath. Gastrointestinal: No abdominal pain.  No nausea, no vomiting.  No diarrhea.  No constipation. Genitourinary: Negative for dysuria. Musculoskeletal: Left foot pain. Skin: Negative for rash. Neurological: Negative for headaches, focal weakness or numbness. Endocrine:Hypertension ____________________________________________   PHYSICAL EXAM:  VITAL SIGNS: ED Triage Vitals [10/28/17 1157]  Enc Vitals Group     BP (!) 151/101     Pulse Rate 92     Resp 16     Temp 98.1 F (36.7 C)     Temp Source Oral     SpO2 98 %     Weight 220 lb (99.8 kg)     Height 5\' 5"  (1.651 m)     Head  Circumference      Peak Flow      Pain Score 9     Pain Loc      Pain Edu?      Excl. in GC?     Constitutional: Alert and oriented. Well appearing and in no acute distress. Cardiovascular: Normal rate, regular rhythm. Grossly normal heart sounds.  Good peripheral circulation. Respiratory: Normal respiratory effort.  No retractions. Lungs CTAB. Musculoskeletal: Bilateral pes planus.  No obvious edema or erythema. Neurologic:  Normal speech and language. No gross focal neurologic deficits are appreciated. No gait instability. Skin:  Skin is warm, dry and intact. No rash noted. Psychiatric: Mood and affect are normal. Speech and behavior are normal.  ____________________________________________   LABS (all labs ordered are listed, but only abnormal results are displayed)  Labs Reviewed - No data to display ____________________________________________  EKG   ____________________________________________  RADIOLOGY  ED MD interpretation:    Official radiology report(s): Dg Ankle Complete Left  Result Date: 10/28/2017 CLINICAL DATA:  Left ankle pain for 3 weeks without known injury. EXAM: LEFT ANKLE COMPLETE - 3+ VIEW COMPARISON:  None. FINDINGS: There is no evidence of fracture, dislocation, or joint effusion. There is no evidence of arthropathy or other focal bone abnormality. Soft tissues are unremarkable. IMPRESSION: Negative. Electronically Signed   By: Lupita Raider, M.D.   On: 10/28/2017 12:41    ____________________________________________   PROCEDURES  Procedure(s) performed: None  Procedures  Critical Care performed: No  ____________________________________________   INITIAL IMPRESSION / ASSESSMENT AND PLAN / ED COURSE  As part of my medical decision making, I reviewed the following data within the electronic MEDICAL RECORD NUMBER    Left foot pain secondary to pes planus and plantar fasciitis.  Discussed x-ray findings with patient.  Patient given  discharge care instructions advised to follow-up with podiatry for definitive evaluation and treatment.  Take medication as directed.       ____________________________________________   FINAL CLINICAL IMPRESSION(S) / ED DIAGNOSES  Final diagnoses:  Pes planus of both feet  Plantar fasciitis of left foot     ED Discharge Orders         Ordered    traMADol (ULTRAM) 50 MG tablet  Every 6 hours PRN     10/28/17 1249    naproxen (NAPROSYN) 500 MG tablet  2 times daily with meals     10/28/17 1249           Note:  This document was prepared using Dragon voice recognition software and may include unintentional dictation errors.    Joni Reining, PA-C 10/28/17 1254    Governor Rooks, MD 10/28/17 [phone]

## 2018-02-21 IMAGING — CR DG CHEST 2V
2 series · 2 of 2 positions shown · non-contrast
Comparison: 02/20/2011

CLINICAL DATA: Hypoxic

EXAM:
CHEST  2 VIEW

[chest pa]
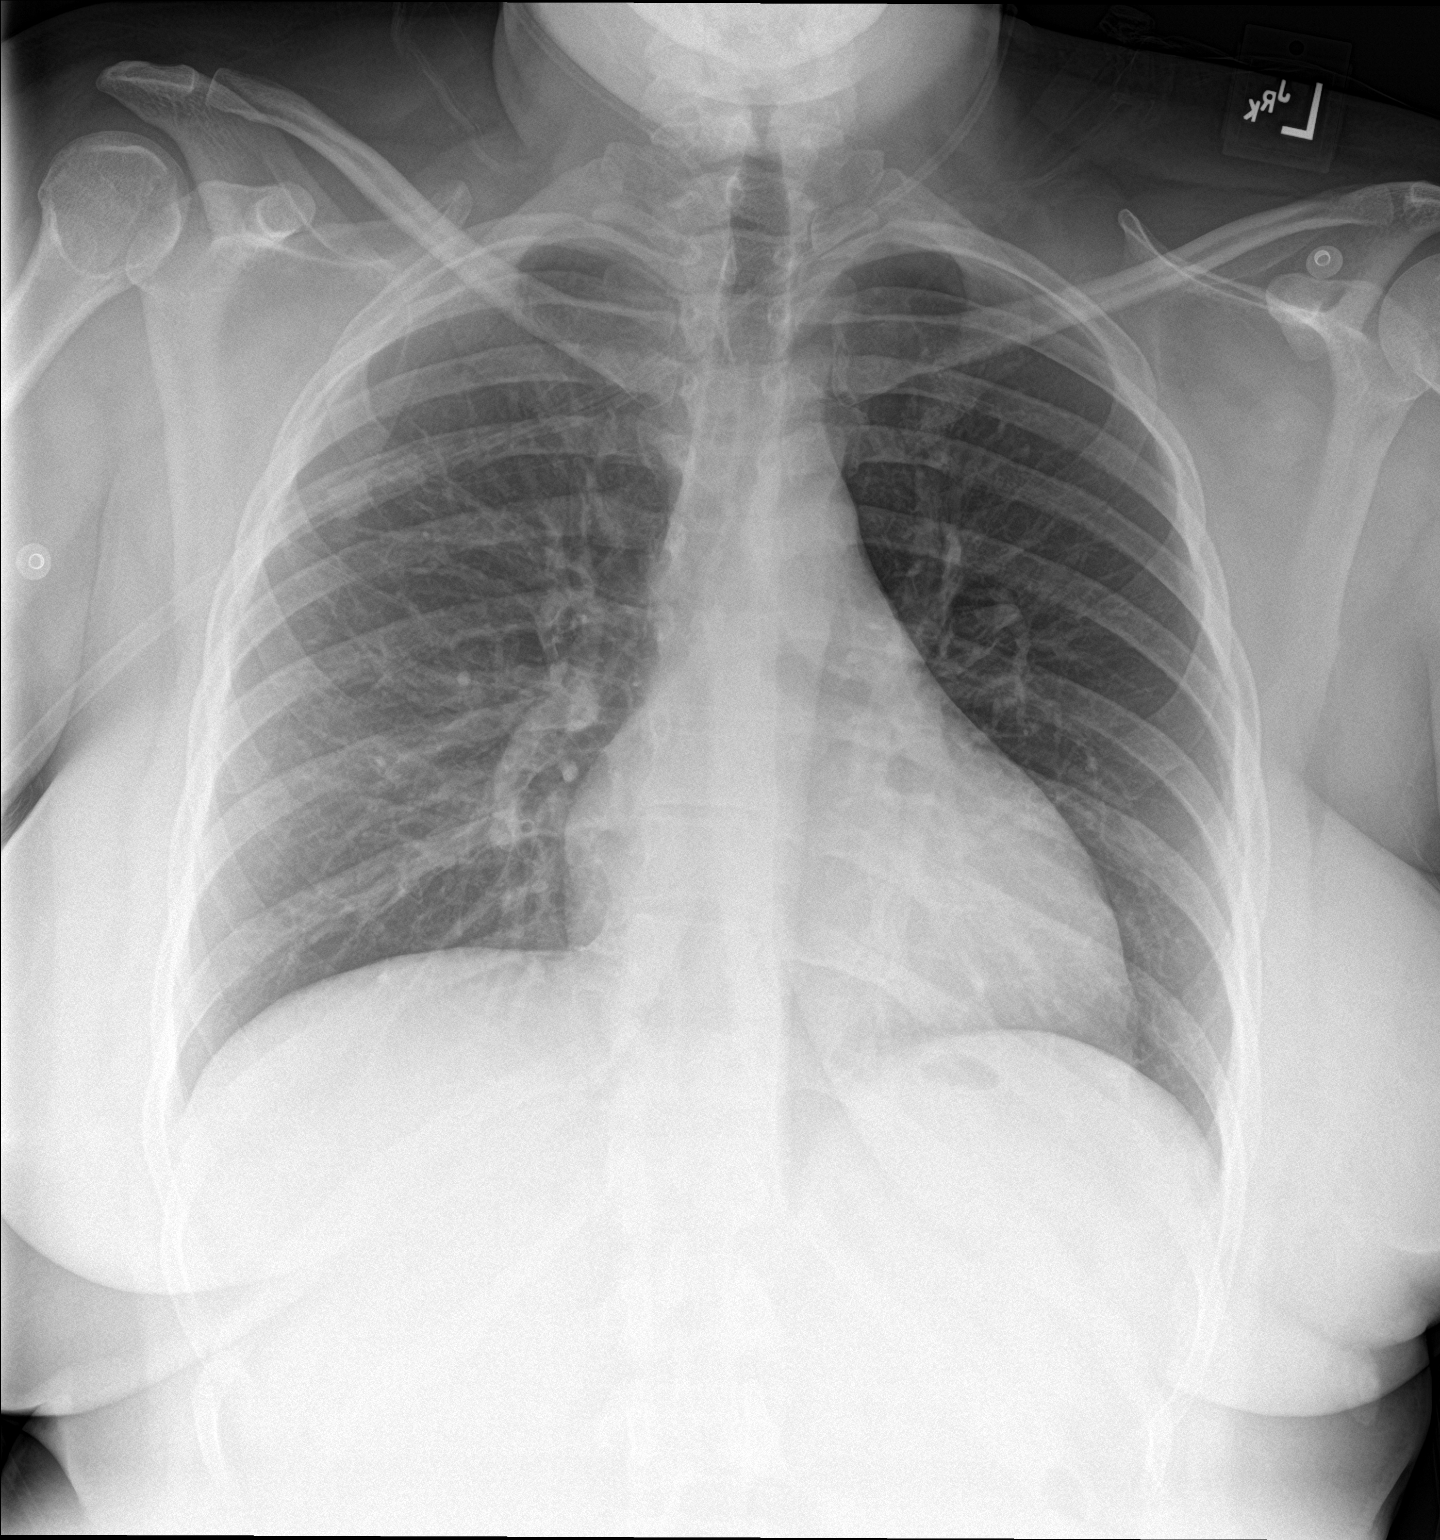

[chest lat]
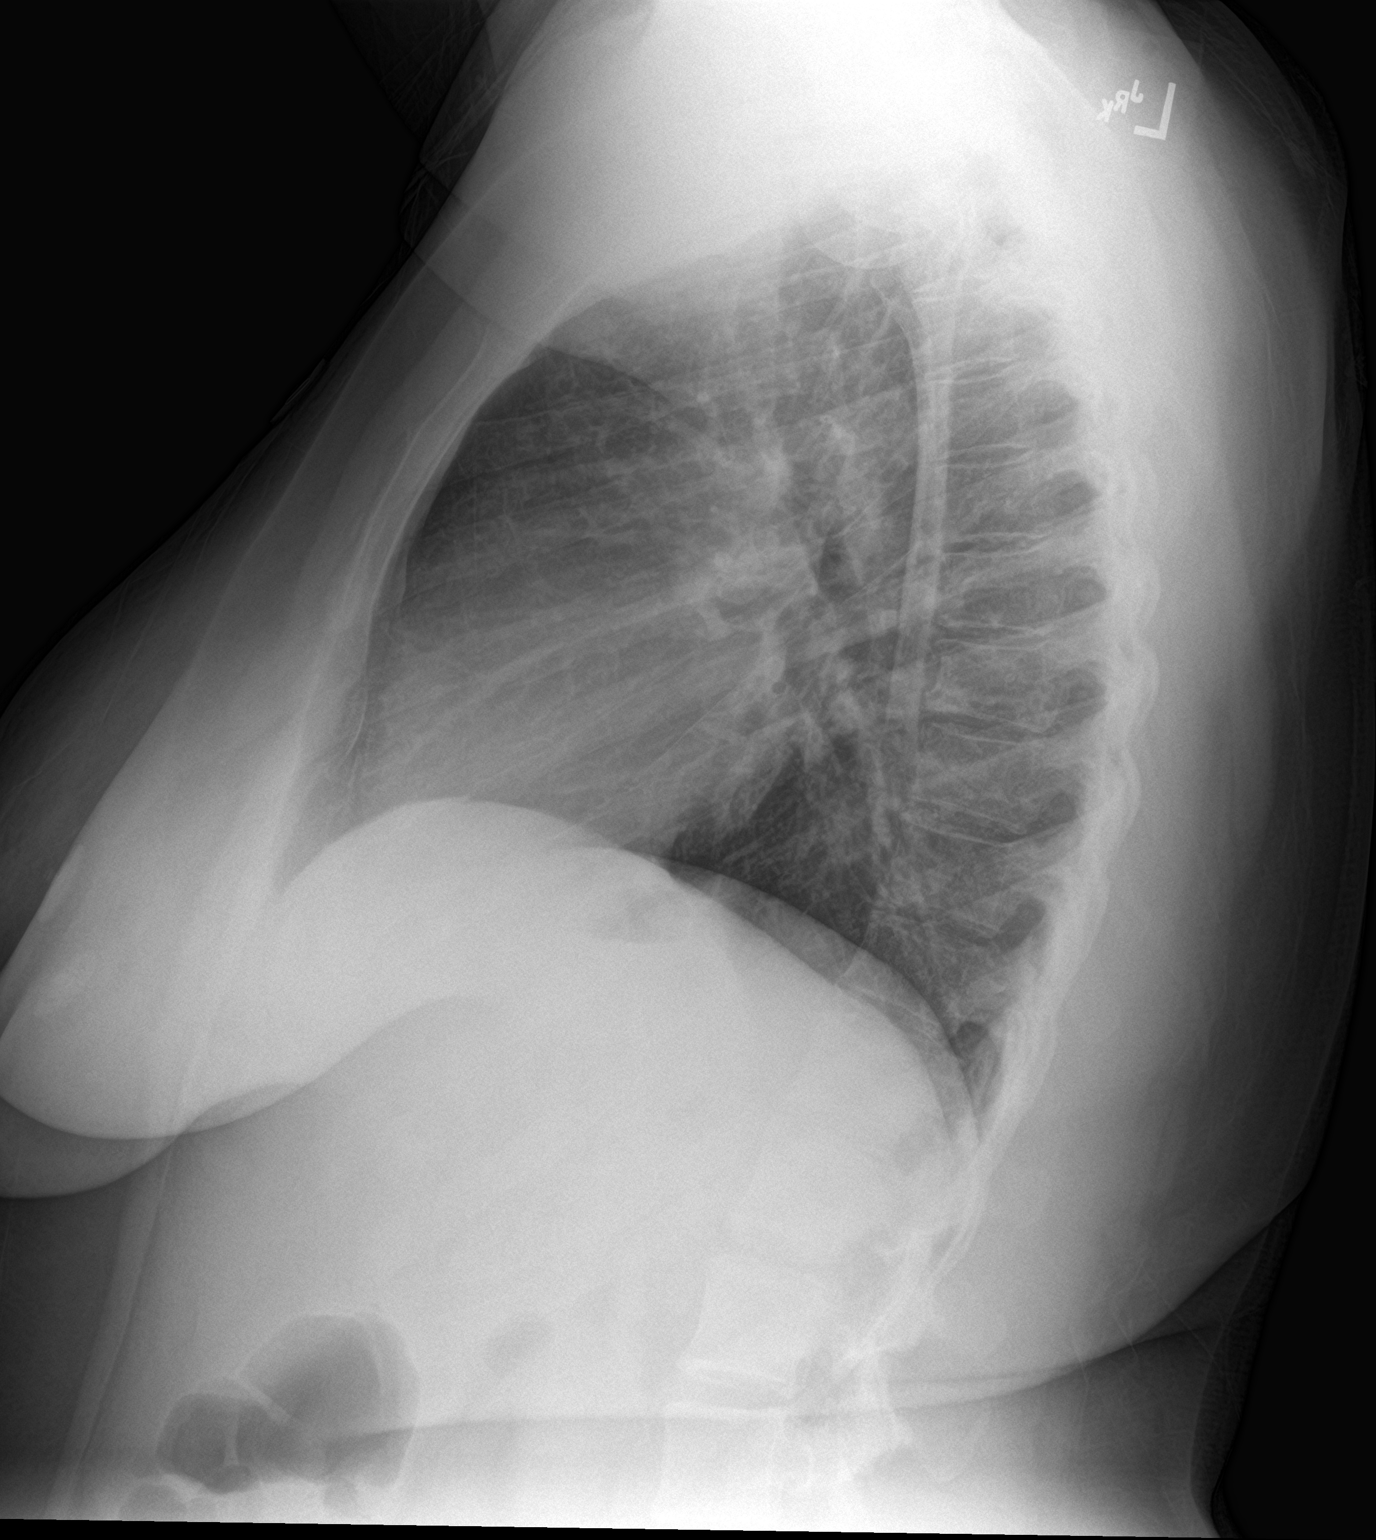

[2 of 2 positions shown; findings below may reference images not displayed]

FINDINGS: The heart size and mediastinal contours are within normal limits.
Both lungs are clear. The visualized skeletal structures are
unremarkable.
IMPRESSION: No active cardiopulmonary disease.

## 2019-04-17 IMAGING — US US EXTREM LOW VENOUS*L*
1 series · 13 of 24 positions shown · non-contrast
Comparison: None.

CLINICAL DATA: Left lower extremity pain for 3 weeks.



[Series 1: us extrem low venous*left* · 0.09mm/px · 13 of 35 slices shown]
[im 1/35]
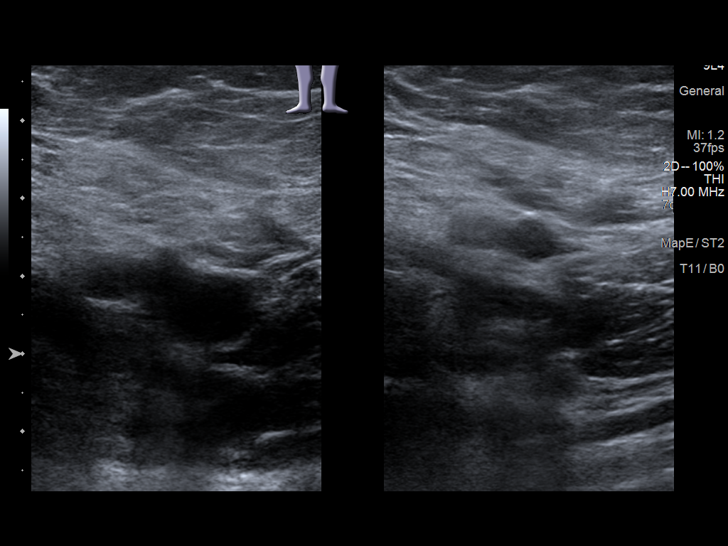
[im 3/35]
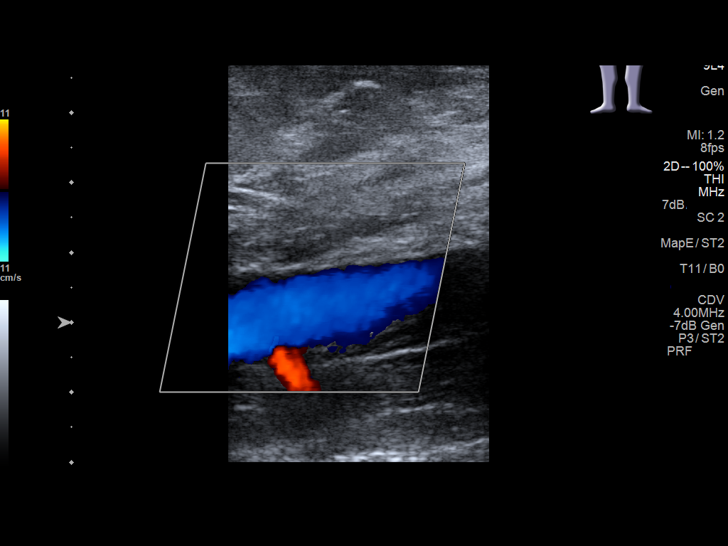
[im 6/35]
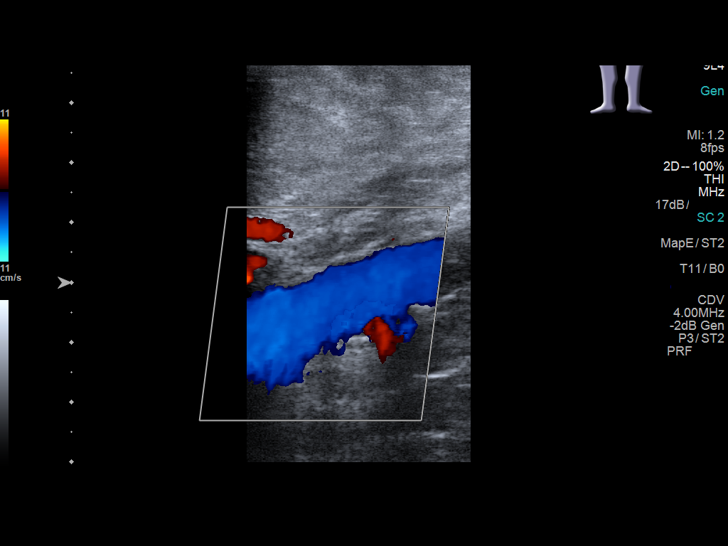
[im 9/35]
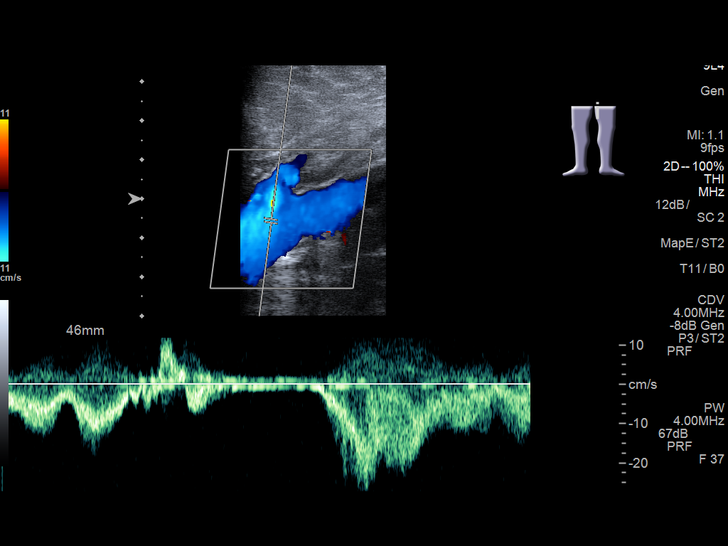
[im 12/35]
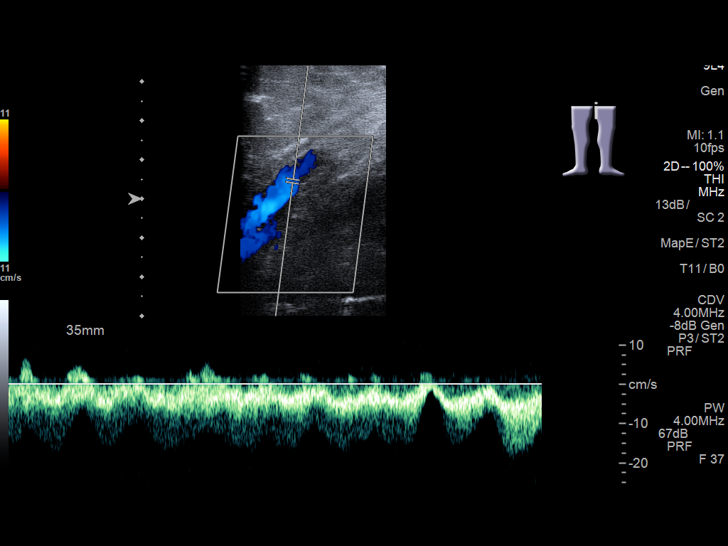
[im 15/35]
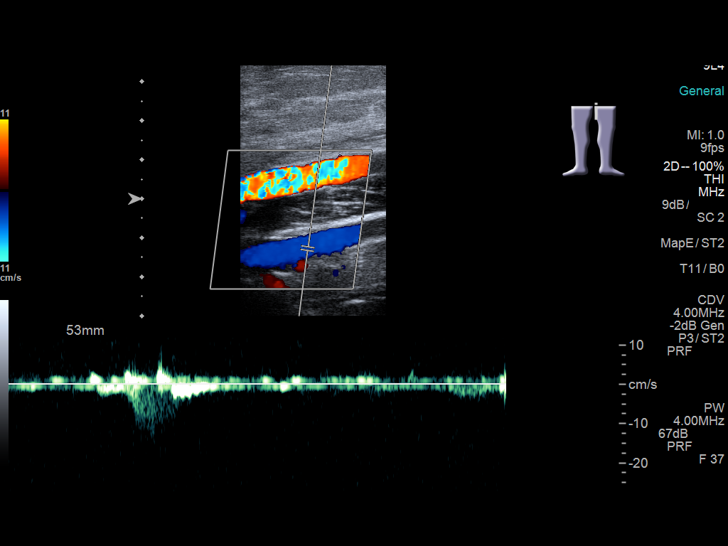
[im 18/35]
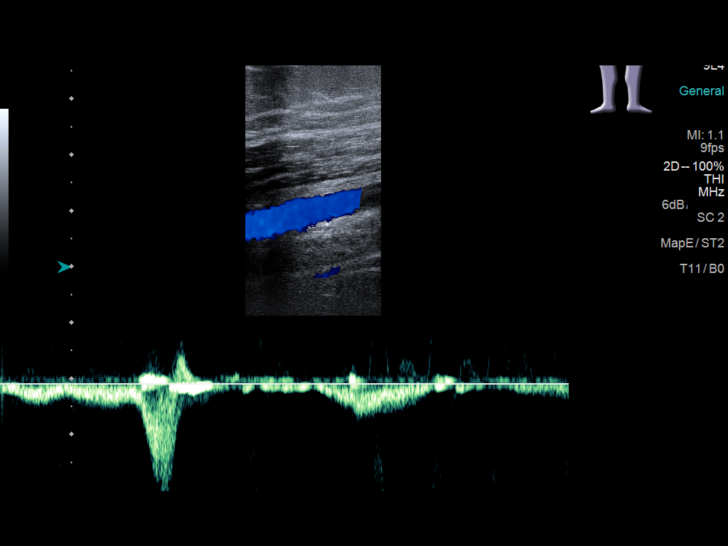
[im 20/35]
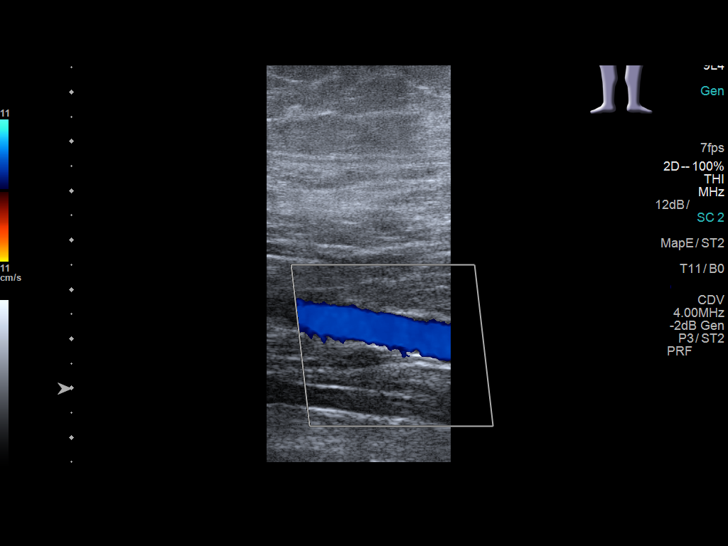
[im 23/35]
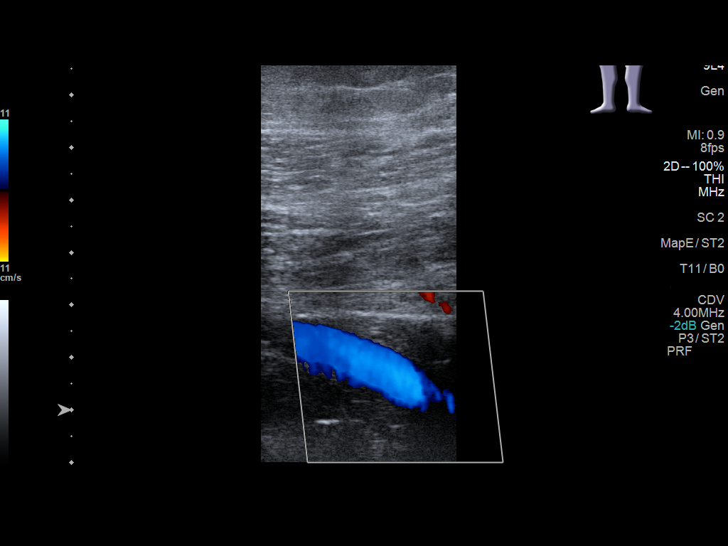
[im 26/35]
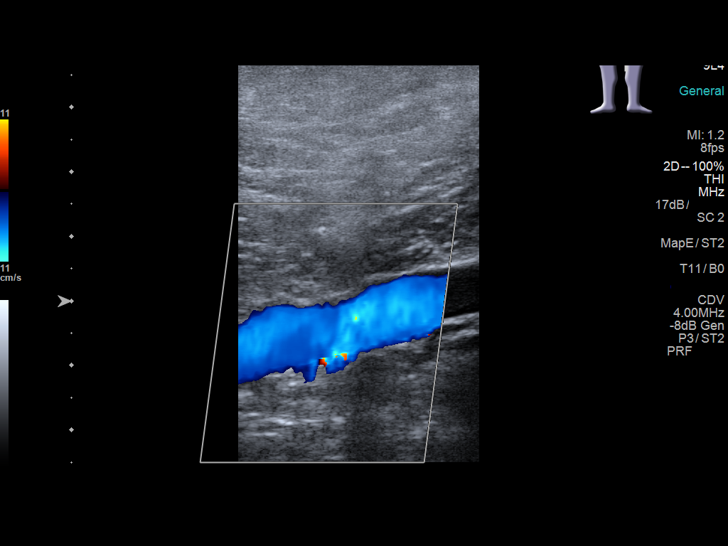
[im 29/35]
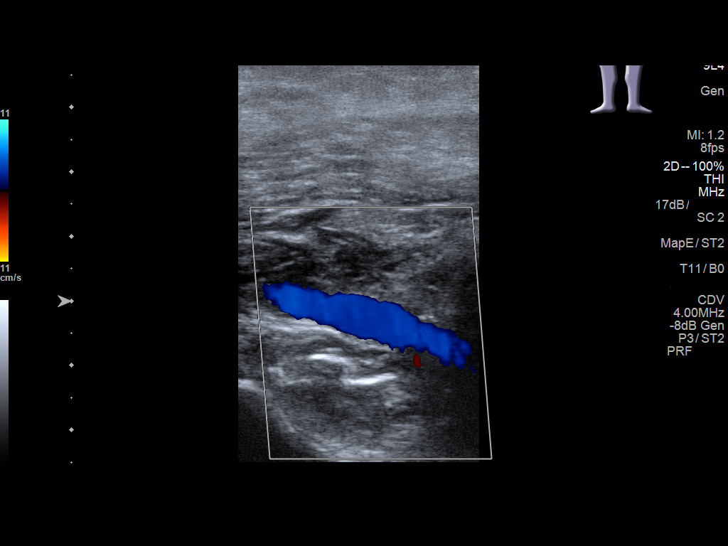
[im 32/35]
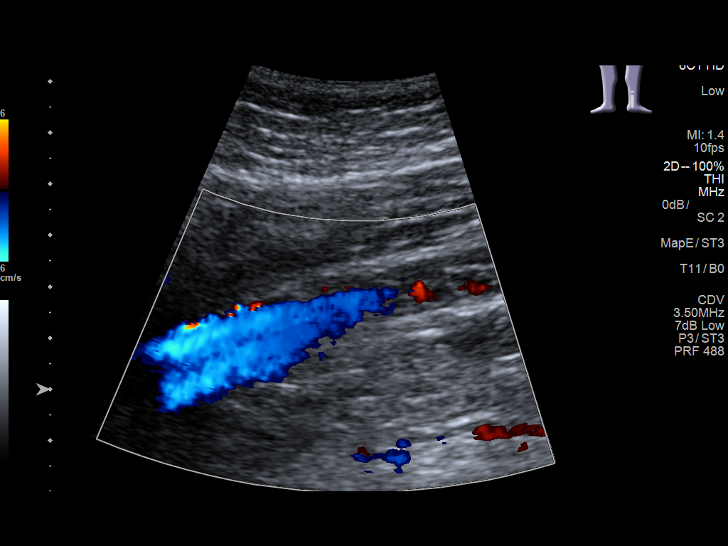
[im 35/35]
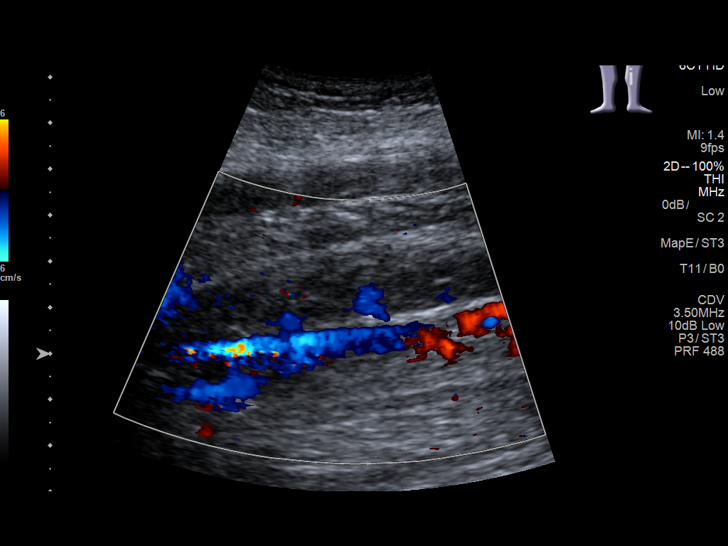

[13 of 24 positions shown; findings below may reference images not displayed]

FINDINGS: Contralateral Common Femoral Vein: Respiratory phasicity is normal
and symmetric with the symptomatic side. No evidence of thrombus.
Normal compressibility.

Common Femoral Vein: No evidence of thrombus. Normal
compressibility, respiratory phasicity and response to augmentation.

Saphenofemoral Junction: No evidence of thrombus. Normal
compressibility and flow on color Doppler imaging.

Profunda Femoral Vein: No evidence of thrombus. Normal
compressibility and flow on color Doppler imaging.

Femoral Vein: No evidence of thrombus. Normal compressibility,
respiratory phasicity and response to augmentation.

Popliteal Vein: No evidence of thrombus. Normal compressibility,
respiratory phasicity and response to augmentation.

Calf Veins: No evidence of thrombus. Normal compressibility and flow
on color Doppler imaging.

Superficial Great Saphenous Vein: No evidence of thrombus. Normal
compressibility.

Venous Reflux:  None.

Other Findings:  None.
IMPRESSION: No evidence of deep venous thrombosis in the left lower extremity.

## 2019-09-01 IMAGING — CR DG CHEST 2V
2 series · 2 of 2 positions shown · non-contrast
Comparison: 05/26/2017 and prior radiographs

CLINICAL DATA: Acute LEFT chest pain for 1 day.

EXAM:
CHEST - 2 VIEW

[chest pa]
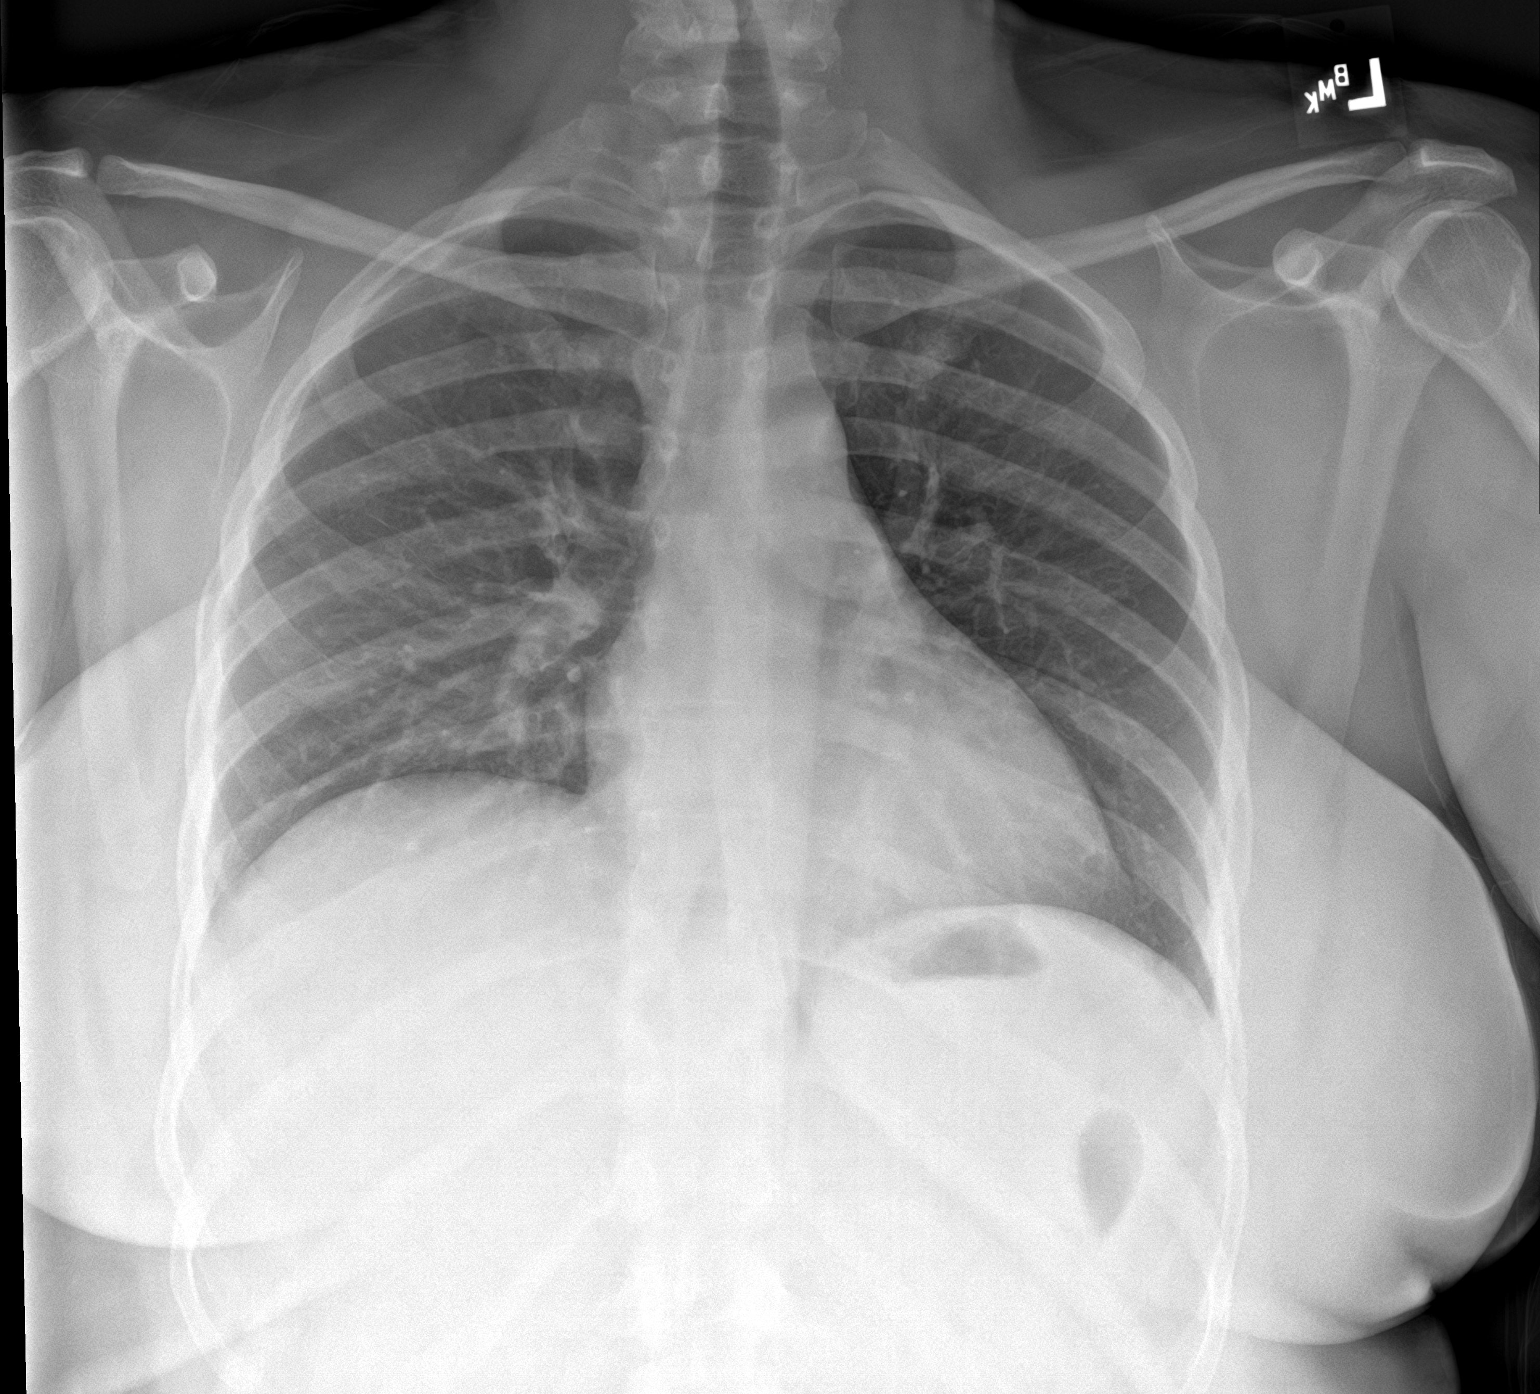

[chest lat]
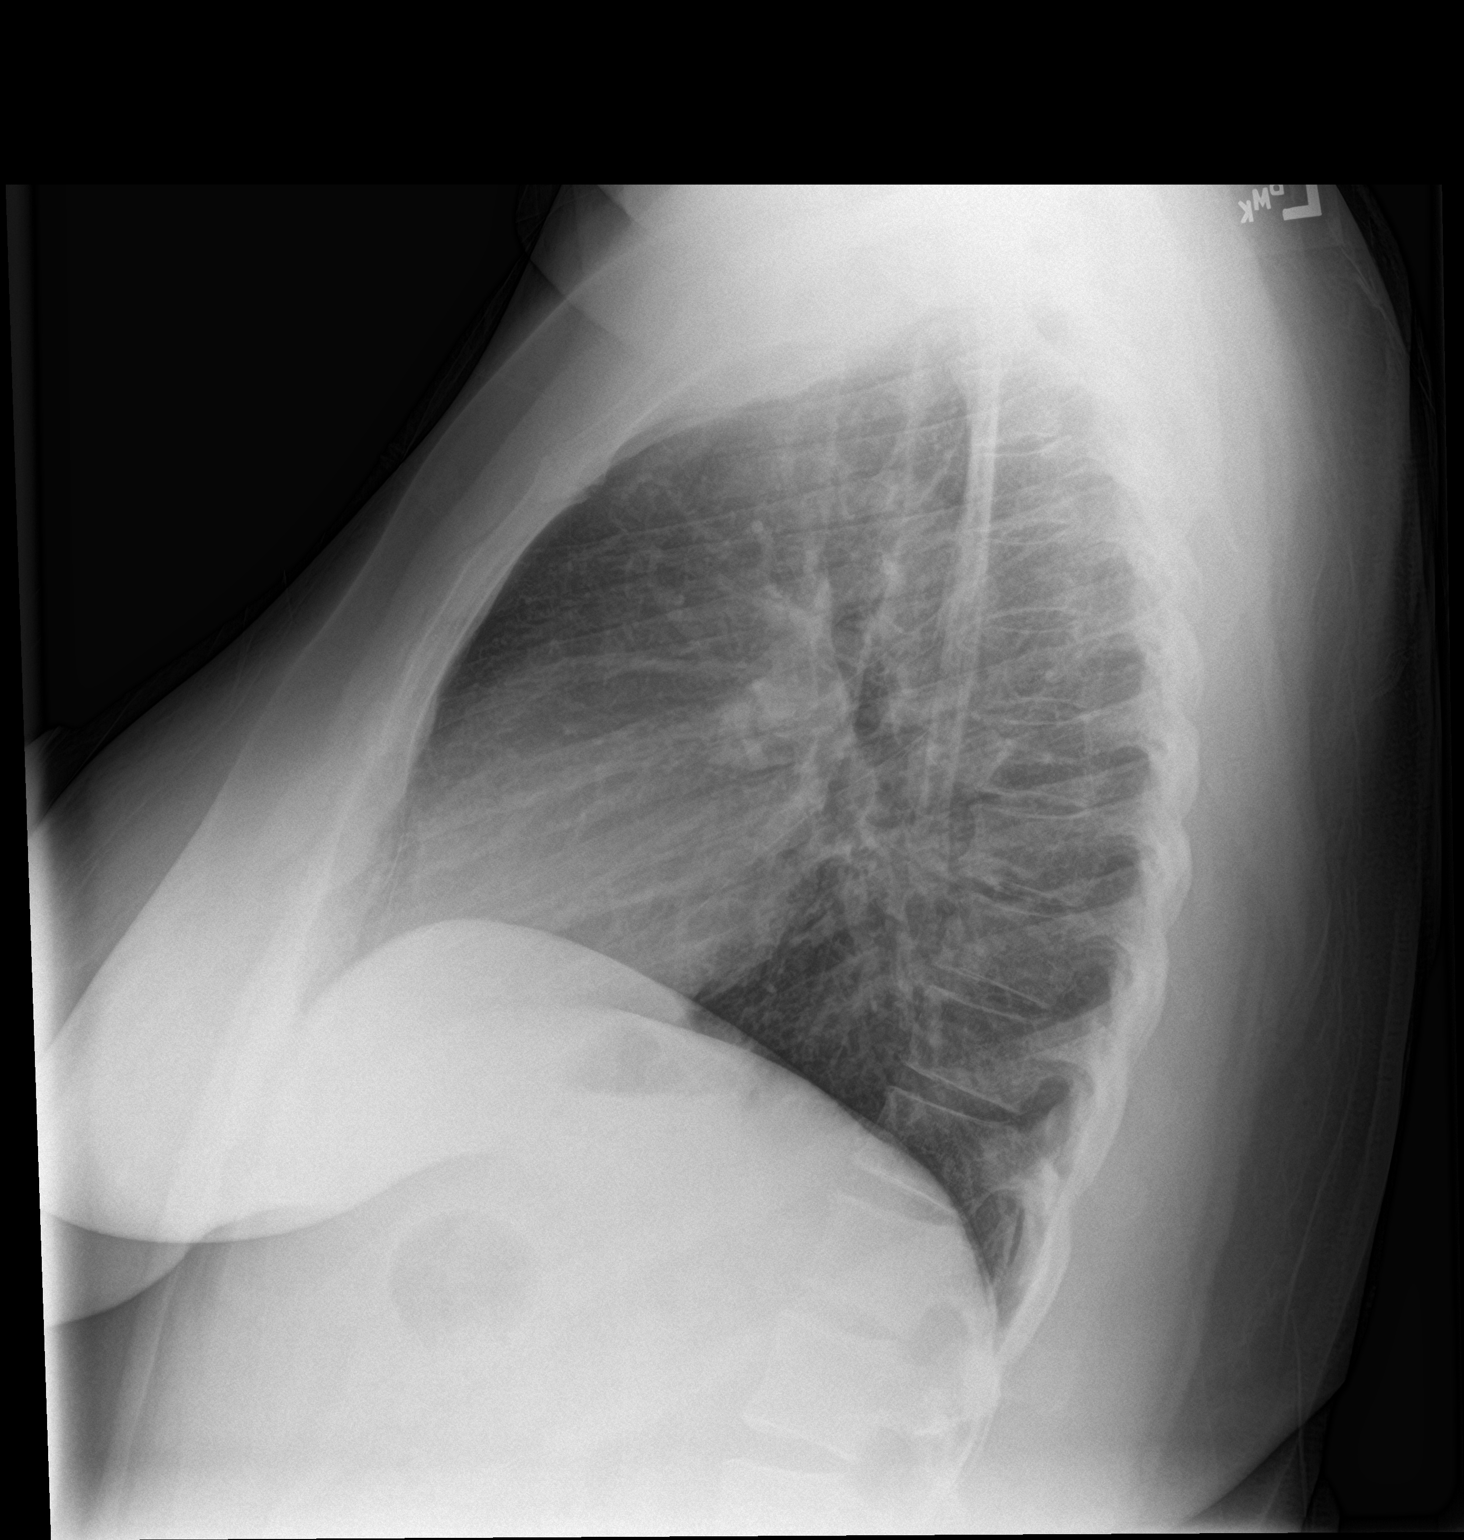

[2 of 2 positions shown; findings below may reference images not displayed]

FINDINGS: The cardiomediastinal silhouette is unremarkable.

Mild elevation of the RIGHT hemidiaphragm noted.

There is no evidence of focal airspace disease, pulmonary edema,
suspicious pulmonary nodule/mass, pleural effusion, or pneumothorax.

No acute bony abnormalities are identified.
IMPRESSION: No active cardiopulmonary disease.
